# Patient Record
Sex: Female | Born: 1990
Health system: Southern US, Community
[De-identification: ages and names within clinical notes are randomized; demographics above are authoritative.]

## PROBLEM LIST (undated history)

## (undated) ENCOUNTER — Inpatient Hospital Stay (HOSPITAL_COMMUNITY): Payer: Self-pay

## (undated) DIAGNOSIS — N870 Mild cervical dysplasia: Secondary | ICD-10-CM

## (undated) DIAGNOSIS — N809 Endometriosis, unspecified: Secondary | ICD-10-CM

## (undated) DIAGNOSIS — G35 Multiple sclerosis: Secondary | ICD-10-CM

## (undated) DIAGNOSIS — R87611 Atypical squamous cells cannot exclude high grade squamous intraepithelial lesion on cytologic smear of cervix (ASC-H): Secondary | ICD-10-CM

## (undated) DIAGNOSIS — F410 Panic disorder [episodic paroxysmal anxiety] without agoraphobia: Secondary | ICD-10-CM

## (undated) HISTORY — DX: Atypical squamous cells cannot exclude high grade squamous intraepithelial lesion on cytologic smear of cervix (ASC-H): R87.611

## (undated) HISTORY — PX: TONSILLECTOMY: SUR1361

## (undated) HISTORY — DX: Mild cervical dysplasia: N87.0

## (undated) HISTORY — DX: Multiple sclerosis: G35

## (undated) HISTORY — PX: WISDOM TOOTH EXTRACTION: SHX21

## (undated) HISTORY — DX: Endometriosis, unspecified: N80.9

## (undated) HISTORY — PX: DIAGNOSTIC LAPAROSCOPY: SUR761

---

## 2010-12-10 DIAGNOSIS — N870 Mild cervical dysplasia: Secondary | ICD-10-CM

## 2010-12-10 DIAGNOSIS — R87611 Atypical squamous cells cannot exclude high grade squamous intraepithelial lesion on cytologic smear of cervix (ASC-H): Secondary | ICD-10-CM

## 2010-12-10 HISTORY — DX: Mild cervical dysplasia: N87.0

## 2010-12-10 HISTORY — DX: Atypical squamous cells cannot exclude high grade squamous intraepithelial lesion on cytologic smear of cervix (ASC-H): R87.611

## 2014-09-17 ENCOUNTER — Emergency Department (HOSPITAL_COMMUNITY)
Admission: EM | Admit: 2014-09-17 | Discharge: 2014-09-18 | Disposition: A | Discharge status: Home / Self Care | Payer: Self-pay | Attending: Emergency Medicine | Admitting: Emergency Medicine

## 2014-09-17 ENCOUNTER — Encounter (HOSPITAL_COMMUNITY): Payer: Self-pay | Admitting: Emergency Medicine

## 2014-09-17 ENCOUNTER — Emergency Department (HOSPITAL_COMMUNITY): Payer: Self-pay

## 2014-09-17 DIAGNOSIS — R000 Tachycardia, unspecified: Secondary | ICD-10-CM | POA: Insufficient documentation

## 2014-09-17 DIAGNOSIS — Z3202 Encounter for pregnancy test, result negative: Secondary | ICD-10-CM | POA: Insufficient documentation

## 2014-09-17 DIAGNOSIS — R002 Palpitations: Secondary | ICD-10-CM | POA: Insufficient documentation

## 2014-09-17 DIAGNOSIS — R202 Paresthesia of skin: Secondary | ICD-10-CM | POA: Insufficient documentation

## 2014-09-17 DIAGNOSIS — Z720 Tobacco use: Secondary | ICD-10-CM | POA: Insufficient documentation

## 2014-09-17 DIAGNOSIS — M79602 Pain in left arm: Secondary | ICD-10-CM | POA: Insufficient documentation

## 2014-09-17 DIAGNOSIS — R0602 Shortness of breath: Secondary | ICD-10-CM | POA: Insufficient documentation

## 2014-09-17 DIAGNOSIS — F419 Anxiety disorder, unspecified: Secondary | ICD-10-CM | POA: Insufficient documentation

## 2014-09-17 DIAGNOSIS — R079 Chest pain, unspecified: Secondary | ICD-10-CM | POA: Insufficient documentation

## 2014-09-17 HISTORY — DX: Panic disorder (episodic paroxysmal anxiety): F41.0

## 2014-09-17 LAB — CBC WITH DIFFERENTIAL/PLATELET
Basophils Absolute: 0 10*3/uL (ref 0.0–0.1)
Basophils Relative: 0 % (ref 0–1)
Eosinophils Absolute: 0.2 10*3/uL (ref 0.0–0.7)
Eosinophils Relative: 2 % (ref 0–5)
HCT: 41.8 % (ref 36.0–46.0)
Hemoglobin: 14.4 g/dL (ref 12.0–15.0)
Lymphocytes Relative: 29 % (ref 12–46)
Lymphs Abs: 2.6 10*3/uL (ref 0.7–4.0)
MCH: 31.4 pg (ref 26.0–34.0)
MCHC: 34.4 g/dL (ref 30.0–36.0)
MCV: 91.1 fL (ref 78.0–100.0)
Monocytes Absolute: 0.5 10*3/uL (ref 0.1–1.0)
Monocytes Relative: 6 % (ref 3–12)
NEUTROS ABS: 5.7 10*3/uL (ref 1.7–7.7)
Neutrophils Relative %: 63 % (ref 43–77)
Platelets: 261 10*3/uL (ref 150–400)
RBC: 4.59 MIL/uL (ref 3.87–5.11)
RDW: 12.7 % (ref 11.5–15.5)
WBC: 9 10*3/uL (ref 4.0–10.5)

## 2014-09-17 LAB — BASIC METABOLIC PANEL
ANION GAP: 17 — AB (ref 5–15)
BUN: 10 mg/dL (ref 6–23)
CHLORIDE: 100 meq/L (ref 96–112)
CO2: 23 mEq/L (ref 19–32)
Calcium: 9.2 mg/dL (ref 8.4–10.5)
Creatinine, Ser: 0.55 mg/dL (ref 0.50–1.10)
GFR calc Af Amer: >90 mL/min (ref >90)
GFR calc non Af Amer: >90 mL/min (ref >90)
Glucose, Bld: 110 mg/dL — ABNORMAL HIGH (ref 70–99)
Potassium: 3.6 mEq/L — ABNORMAL LOW (ref 3.7–5.3)
Sodium: 140 mEq/L (ref 137–147)

## 2014-09-17 LAB — D-DIMER, QUANTITATIVE (NOT AT ARMC)

## 2014-09-17 LAB — POC URINE PREG, ED: Preg Test, Ur: NEGATIVE

## 2014-09-17 LAB — I-STAT TROPONIN, ED: Troponin i, poc: 0 ng/mL (ref 0.00–0.08)

## 2014-09-17 NOTE — ED Provider Notes (Signed)
CSN: [id number]     Arrival date & time 09/17/14  2120 History   First MD Initiated Contact with Patient 09/17/14 2209     Chief Complaint  Patient presents with  . Palpitations     (Consider location/radiation/quality/duration/timing/severity/associated sxs/prior Treatment) Patient is a 23 y.o. female presenting with palpitations. The history is provided by the patient and medical records.  Palpitations Associated symptoms: chest pain and shortness of breath    This is a 23 year old female with past medical history significant for anxiety, presenting to the ED for chest pain and palpitations intermittently for the past week.  Patient states when pain comes, localized throughout her entire chest associated with some SOB, pain of her left arm, and paresthesias of her bilateral hands.  She denies any dizziness or weakness.  Patient states some similarities between her recent symptoms and her anxiety symptoms, however seem much more severe. She denies any prior cardiac history. Great grandfather died of massive MI in 79's.  Patient is a daily smoker, started back smoking 2 months ago after quitting for several months.  No recent travel, LE edema, calf pain, recent surgeries, or prolonged immobilization. She is not currently on any exogenous estrogens. She denies history of prior DVT or PE.  Does admit to recent increased stress -- recently moved to [facility] from [place], [place] and has started school.  Patient tachycardic on arrival.  Past Medical History  Diagnosis Date  . Panic    Past Surgical History  Procedure Laterality Date  . Tonsillectomy     No family history on file. History  Substance Use Topics  . Smoking status: Current Every Day Smoker  . Smokeless tobacco: Not on file  . Alcohol Use: Yes     Comment: occ   OB History   Grav Para Term Preterm Abortions TAB SAB Ect Mult Living                 Review of Systems  Respiratory: Positive for shortness of breath.    Cardiovascular: Positive for chest pain and palpitations.  All other systems reviewed and are negative.     Allergies  Review of patient's allergies indicates no known allergies.  Home Medications   Prior to Admission medications   Not on File   BP 158/107  Pulse 108  Temp(Src) 98.3 F (36.8 C) (Oral)  Resp 15  Ht 5\' 3"  (1.6 m)  Wt 150 lb (68.04 kg)  BMI 26.58 kg/m2  SpO2 98%  LMP 08/27/2014  Physical Exam  Nursing note and vitals reviewed. Constitutional: She is oriented to person, place, and time. She appears well-developed and well-nourished.  HENT:  Head: Normocephalic and atraumatic.  Mouth/Throat: Oropharynx is clear and moist.  Eyes: Conjunctivae and EOM are normal. Pupils are equal, round, and reactive to light.  Neck: Normal range of motion. Neck supple.  Cardiovascular: Regular rhythm and normal heart sounds.  Tachycardia present.   Pulmonary/Chest: Effort normal and breath sounds normal. No respiratory distress. She has no wheezes.  Abdominal: Soft. Bowel sounds are normal.  Musculoskeletal: Normal range of motion.  Neurological: She is alert and oriented to person, place, and time.  Skin: Skin is warm and dry.  Psychiatric: Her mood appears anxious.  Anxious, tearful    ED Course  Procedures (including critical care time) Labs Review Labs Reviewed  BASIC METABOLIC PANEL - Abnormal; Notable for the following:    Potassium 3.6 (*)    Glucose, Bld 110 (*)    Anion gap  17 (*)    All other components within normal limits  CBC WITH DIFFERENTIAL  D-DIMER, QUANTITATIVE  I-STAT TROPOININ, ED  POC URINE PREG, ED    Imaging Review Dg Chest 2 View  09/17/2014   CLINICAL DATA:  Acute onset of chest pain and shortness of breath for 1 week. Current history of smoking. Initial encounter.  EXAM: CHEST  2 VIEW  COMPARISON:  None.  FINDINGS: The lungs are well-aerated and clear. There is no evidence of focal opacification, pleural effusion or pneumothorax.  The  heart is normal in size; the mediastinal contour is within normal limits. No acute osseous abnormalities are seen. Bilateral nipple shadows are seen.  IMPRESSION: No acute cardiopulmonary process seen.   Electronically Signed   By: Roanna RaiderJeffery  Chang M.D.   On: 09/17/2014 22:55     EKG Interpretation None      MDM   Final diagnoses:  Chest pain, unspecified chest pain type  Palpitations   23 year old female with history of anxiety, presenting to the ED for intermittent chest discomfort and palpitations over the past week. She states some similarity of symptoms between her anxiety and current sx, but states recently have been much worse. Does admit to recently increased stress due to relocation and starting school.  EKG sinus tachycardia without ischemic change.  Chest x-ray is clear. Lab work including troponin and d-dimer negative.  At this time, low suspicion for ACS, PE, dissection, or other acute cardiac event. Suspect that anxiety is likely contributing to her symptoms.  Denies SI/HI/AVH.  Patient was previously seen by counselor, which she states helped her symptoms greatly. Will give psychiatric resource guide so that she may reestablish care here in [place]. Short supply of Ativan as needed until followup.  Discussed plan with patient, he/she acknowledged understanding and agreed with plan of care.  Return precautions given for new or worsening symptoms.  Garlon HatchetLisa M Zhanae Proffit, PA-C 09/18/14 [phone]

## 2014-09-17 NOTE — ED Notes (Signed)
Pt c/o upper chest discomfort with intermittent palpitations x 1 week. Pt states she has anxiety but this does not feel like her normal panic attacks.

## 2014-09-18 MED ORDER — LORAZEPAM 1 MG PO TABS: 1.0000 mg | ORAL_TABLET | Freq: Three times a day (TID) | ORAL | Status: DC | PRN

## 2014-09-18 NOTE — Discharge Instructions (Signed)
Take the prescribed medication as directed. Follow-up with one of the counseling or psychiatry services on the resource list if you wish to do so. Return to the ED for new or worsening symptoms.

## 2014-09-18 NOTE — ED Provider Notes (Signed)
Medical screening examination/treatment/procedure(s) were performed by non-physician practitioner and as supervising physician I was immediately available for consultation/collaboration.   EKG Interpretation None       Antonius Hartlage, MD 09/18/14 1955 

## 2014-09-28 ENCOUNTER — Encounter (HOSPITAL_COMMUNITY): Payer: Self-pay | Admitting: Emergency Medicine

## 2014-09-28 ENCOUNTER — Emergency Department (HOSPITAL_COMMUNITY)
Admission: EM | Admit: 2014-09-28 | Discharge: 2014-09-28 | Discharge status: Against Medical Advice | Payer: Self-pay | Attending: Emergency Medicine | Admitting: Emergency Medicine

## 2014-09-28 DIAGNOSIS — Z5321 Procedure and treatment not carried out due to patient leaving prior to being seen by health care provider: Secondary | ICD-10-CM

## 2014-09-28 DIAGNOSIS — Z720 Tobacco use: Secondary | ICD-10-CM | POA: Insufficient documentation

## 2014-09-28 DIAGNOSIS — R079 Chest pain, unspecified: Secondary | ICD-10-CM | POA: Insufficient documentation

## 2014-09-28 NOTE — ED Notes (Signed)
Called multiple time no answer.

## 2014-09-28 NOTE — ED Notes (Signed)
Pt reports central chest burning that "goes into my throat." States this started that smoking marijuana for the first time. States after this her "heart started racing, my chest started burning." Pt states she just wants to get checked out. Pt in NAD.

## 2014-10-03 NOTE — ED Provider Notes (Signed)
Pt left without being seen.  Mirian Mo, MD 10/03/14 [phone]

## 2015-01-20 ENCOUNTER — Encounter (HOSPITAL_COMMUNITY): Payer: Self-pay | Admitting: *Deleted

## 2015-01-20 ENCOUNTER — Emergency Department (HOSPITAL_COMMUNITY)
Admission: EM | Admit: 2015-01-20 | Discharge: 2015-01-20 | Disposition: A | Discharge status: Home / Self Care | Payer: Self-pay | Attending: Emergency Medicine | Admitting: Emergency Medicine

## 2015-01-20 ENCOUNTER — Emergency Department (HOSPITAL_COMMUNITY): Payer: Self-pay

## 2015-01-20 DIAGNOSIS — Z3202 Encounter for pregnancy test, result negative: Secondary | ICD-10-CM | POA: Insufficient documentation

## 2015-01-20 DIAGNOSIS — R002 Palpitations: Secondary | ICD-10-CM | POA: Insufficient documentation

## 2015-01-20 DIAGNOSIS — Z720 Tobacco use: Secondary | ICD-10-CM | POA: Insufficient documentation

## 2015-01-20 DIAGNOSIS — R0602 Shortness of breath: Secondary | ICD-10-CM | POA: Insufficient documentation

## 2015-01-20 DIAGNOSIS — R0789 Other chest pain: Secondary | ICD-10-CM | POA: Insufficient documentation

## 2015-01-20 DIAGNOSIS — F419 Anxiety disorder, unspecified: Secondary | ICD-10-CM | POA: Insufficient documentation

## 2015-01-20 LAB — BASIC METABOLIC PANEL
Anion gap: 10 (ref 5–15)
BUN: 16 mg/dL (ref 6–23)
CO2: 25 mmol/L (ref 19–32)
Calcium: 9.4 mg/dL (ref 8.4–10.5)
Chloride: 103 mmol/L (ref 96–112)
Creatinine, Ser: 0.66 mg/dL (ref 0.50–1.10)
GFR calc Af Amer: >90 mL/min (ref >90)
GFR calc non Af Amer: >90 mL/min (ref >90)
GLUCOSE: 99 mg/dL (ref 70–99)
Potassium: 3.5 mmol/L (ref 3.5–5.1)
Sodium: 138 mmol/L (ref 135–145)

## 2015-01-20 LAB — CBC
HEMATOCRIT: 43.3 % (ref 36.0–46.0)
Hemoglobin: 14.9 g/dL (ref 12.0–15.0)
MCH: 32.3 pg (ref 26.0–34.0)
MCHC: 34.4 g/dL (ref 30.0–36.0)
MCV: 93.9 fL (ref 78.0–100.0)
Platelets: 263 10*3/uL (ref 150–400)
RBC: 4.61 MIL/uL (ref 3.87–5.11)
RDW: 12.7 % (ref 11.5–15.5)
WBC: 9.3 10*3/uL (ref 4.0–10.5)

## 2015-01-20 LAB — I-STAT TROPONIN, ED: Troponin i, poc: 0 ng/mL (ref 0.00–0.08)

## 2015-01-20 LAB — PREGNANCY, URINE: PREG TEST UR: NEGATIVE

## 2015-01-20 MED ORDER — ALPRAZOLAM 0.25 MG PO TABS
0.2500 mg | ORAL_TABLET | Freq: Every evening | ORAL | Status: DC | PRN
Start: 2015-01-20 — End: 2016-12-21

## 2015-01-20 NOTE — ED Notes (Signed)
Pt reports mid-cp which started x 2 hours ago.  Pt reports dizziness intermittently since the pain started.  Pt reports pain radiates to her L arm.  Pt also reports tingling in her L hand.  Reports pain is worse when sitting down.  Attempted to lay down around 2200 and pain became worse.  Pt reports her great great grandfather died of a heart attack in his 60s.

## 2015-01-20 NOTE — ED Notes (Signed)
Bed: WA25 Expected date:  Expected time:  Means of arrival:  Comments: Triage 2 

## 2015-01-20 NOTE — ED Notes (Signed)
Pt. On cardiac monitor. 

## 2015-01-20 NOTE — Discharge Instructions (Signed)
Panic Attacks °Panic attacks are sudden, short-lived surges of severe anxiety, fear, or discomfort. They may occur for no reason when you are relaxed, when you are anxious, or when you are sleeping. Panic attacks may occur for a number of reasons:  °· Healthy people occasionally have panic attacks in extreme, life-threatening situations, such as war or natural disasters. Normal anxiety is a protective mechanism of the body that helps us react to danger (fight or flight response). °· Panic attacks are often seen with anxiety disorders, such as panic disorder, social anxiety disorder, generalized anxiety disorder, and phobias. Anxiety disorders cause excessive or uncontrollable anxiety. They may interfere with your relationships or other life activities. °· Panic attacks are sometimes seen with other mental illnesses, such as depression and posttraumatic stress disorder. °· Certain medical conditions, prescription medicines, and drugs of abuse can cause panic attacks. °SYMPTOMS  °Panic attacks start suddenly, peak within 20 minutes, and are accompanied by four or more of the following symptoms: °· Pounding heart or fast heart rate (palpitations). °· Sweating. °· Trembling or shaking. °· Shortness of breath or feeling smothered. °· Feeling choked. °· Chest pain or discomfort. °· Nausea or strange feeling in your stomach. °· Dizziness, light-headedness, or feeling like you will faint. °· Chills or hot flushes. °· Numbness or tingling in your lips or hands and feet. °· Feeling that things are not real or feeling that you are not yourself. °· Fear of losing control or going crazy. °· Fear of dying. °Some of these symptoms can mimic serious medical conditions. For example, you may think you are having a heart attack. Although panic attacks can be very scary, they are not life threatening. °DIAGNOSIS  °Panic attacks are diagnosed through an assessment by your health care provider. Your health care provider will ask  questions about your symptoms, such as where and when they occurred. Your health care provider will also ask about your medical history and use of alcohol and drugs, including prescription medicines. Your health care provider may order blood tests or other studies to rule out a serious medical condition. Your health care provider may refer you to a mental health professional for further evaluation. °TREATMENT  °· Most healthy people who have one or two panic attacks in an extreme, life-threatening situation will not require treatment. °· The treatment for panic attacks associated with anxiety disorders or other mental illness typically involves counseling with a mental health professional, medicine, or a combination of both. Your health care provider will help determine what treatment is best for you. °· Panic attacks due to physical illness usually go away with treatment of the illness. If prescription medicine is causing panic attacks, talk with your health care provider about stopping the medicine, decreasing the dose, or substituting another medicine. °· Panic attacks due to alcohol or drug abuse go away with abstinence. Some adults need professional help in order to stop drinking or using drugs. °HOME CARE INSTRUCTIONS  °· Take all medicines as directed by your health care provider.   °· Schedule and attend follow-up visits as directed by your health care provider. It is important to keep all your appointments. °SEEK MEDICAL CARE IF: °· You are not able to take your medicines as prescribed. °· Your symptoms do not improve or get worse. °SEEK IMMEDIATE MEDICAL CARE IF:  °· You experience panic attack symptoms that are different than your usual symptoms. °· You have serious thoughts about hurting yourself or others. °· You are taking medicine for panic attacks and   have a serious side effect. °MAKE SURE YOU: °· Understand these instructions. °· Will watch your condition. °· Will get help right away if you are not  doing well or get worse. °Document Released: 11/26/2005 Document Revised: 12/01/2013 Document Reviewed: 07/10/2013 °ExitCare® Patient Information ©2015 [employer]. This information is not intended to replace advice given to you by your health care provider. Make sure you discuss any questions you have with your health care provider. ° ° °Chest Pain (Nonspecific) °It is often hard to give a specific diagnosis for the cause of chest pain. There is always a chance that your pain could be related to something serious, such as a heart attack or a blood clot in the lungs. You need to follow up with your health care provider for further evaluation. °CAUSES  °· Heartburn. °· Pneumonia or bronchitis. °· Anxiety or stress. °· Inflammation around your heart (pericarditis) or lung (pleuritis or pleurisy). °· A blood clot in the lung. °· A collapsed lung (pneumothorax). It can develop suddenly on its own (spontaneous pneumothorax) or from trauma to the chest. °· Shingles infection (herpes zoster virus). °The chest wall is composed of bones, muscles, and cartilage. Any of these can be the source of the pain. °· The bones can be bruised by injury. °· The muscles or cartilage can be strained by coughing or overwork. °· The cartilage can be affected by inflammation and become sore (costochondritis). °DIAGNOSIS  °Lab tests or other studies may be needed to find the cause of your pain. Your health care provider may have you take a test called an ambulatory electrocardiogram (ECG). An ECG records your heartbeat patterns over a 24-hour period. You may also have other tests, such as: °· Transthoracic echocardiogram (TTE). During echocardiography, sound waves are used to evaluate how blood flows through your heart. °· Transesophageal echocardiogram (TEE). °· Cardiac monitoring. This allows your health care provider to monitor your heart rate and rhythm in real time. °· Holter monitor. This is a portable device that records your  heartbeat and can help diagnose heart arrhythmias. It allows your health care provider to track your heart activity for several days, if needed. °· Stress tests by exercise or by giving medicine that makes the heart beat faster. °TREATMENT  °· Treatment depends on what may be causing your chest pain. Treatment may include: °¨ Acid blockers for heartburn. °¨ Anti-inflammatory medicine. °¨ Pain medicine for inflammatory conditions. °¨ Antibiotics if an infection is present. °· You may be advised to change lifestyle habits. This includes stopping smoking and avoiding alcohol, caffeine, and chocolate. °· You may be advised to keep your head raised (elevated) when sleeping. This reduces the chance of acid going backward from your stomach into your esophagus. °Most of the time, nonspecific chest pain will improve within 2-3 days with rest and mild pain medicine.  °HOME CARE INSTRUCTIONS  °· If antibiotics were prescribed, take them as directed. Finish them even if you start to feel better. °· For the next few days, avoid physical activities that bring on chest pain. Continue physical activities as directed. °· Do not use any tobacco products, including cigarettes, chewing tobacco, or electronic cigarettes. °· Avoid drinking alcohol. °· Only take medicine as directed by your health care provider. °· Follow your health care provider's suggestions for further testing if your chest pain does not go away. °· Keep any follow-up appointments you made. If you do not go to an appointment, you could develop lasting (chronic) problems with pain. If there   is any problem keeping an appointment, call to reschedule. °SEEK MEDICAL CARE IF:  °· Your chest pain does not go away, even after treatment. °· You have a rash with blisters on your chest. °· You have a fever. °SEEK IMMEDIATE MEDICAL CARE IF:  °· You have increased chest pain or pain that spreads to your arm, neck, jaw, back, or abdomen. °· You have shortness of breath. °· You have  an increasing cough, or you cough up blood. °· You have severe back or abdominal pain. °· You feel nauseous or vomit. °· You have severe weakness. °· You faint. °· You have chills. °This is an emergency. Do not wait to see if the pain will go away. Get medical help at once. Call your local emergency services (911 in [place] Do not drive yourself to the hospital. °MAKE SURE YOU:  °· Understand these instructions. °· Will watch your condition. °· Will get help right away if you are not doing well or get worse. °Document Released: 09/05/2005 Document Revised: 12/01/2013 Document Reviewed: 07/01/2008 °ExitCare® Patient Information ©2015 [employer]. This information is not intended to replace advice given to you by your health care provider. Make sure you discuss any questions you have with your health care provider. ° ° °

## 2015-01-20 NOTE — ED Provider Notes (Signed)
CSN: [id number]     Arrival date & time 01/20/15  0000 History   First MD Initiated Contact with Patient 01/20/15 0029     Chief Complaint  Patient presents with  . Chest Pain     (Consider location/radiation/quality/duration/timing/severity/associated sxs/prior Treatment) HPI Patient with history of panic disorder presents with 2 hours of substernal chest pain. She states the pain is stabbing in nature. Worse when lying back or on her given. Associated with shortness of breath and tingling in both hands. Patient states she has chest pain daily due to her anxiety. Pain is improved significantly since being in the emergency department. She denies any lower extremity swelling or pain. She's had no cough or fever. States she smokes about half a pack of cigarettes daily. No recent extended travel or surgeries. Past Medical History  Diagnosis Date  . Panic    Past Surgical History  Procedure Laterality Date  . Tonsillectomy     No family history on file. History  Substance Use Topics  . Smoking status: Current Every Day Smoker -- 0.50 packs/day    Types: Cigarettes  . Smokeless tobacco: Not on file  . Alcohol Use: Yes     Comment: occ   OB History    No data available     Review of Systems  Constitutional: Negative for fever and chills.  Respiratory: Positive for shortness of breath. Negative for cough.   Cardiovascular: Positive for chest pain and palpitations. Negative for leg swelling.  Gastrointestinal: Negative for vomiting, abdominal pain and diarrhea.  Musculoskeletal: Negative for back pain, neck pain and neck stiffness.  Neurological: Negative for dizziness, weakness, light-headedness, numbness and headaches.  Psychiatric/Behavioral: The patient is nervous/anxious.   All other systems reviewed and are negative.     Allergies  Review of patient's allergies indicates no known allergies.  Home Medications   Prior to Admission medications   Not on File   BP  147/95 mmHg  Pulse 89  Temp(Src) 97.9 F (36.6 C) (Oral)  Resp 18  SpO2 100%  LMP 12/29/2014 Physical Exam  Constitutional: She is oriented to person, place, and time. She appears well-developed and well-nourished. No distress.  Anxious appearing  HENT:  Head: Normocephalic and atraumatic.  Mouth/Throat: Oropharynx is clear and moist.  Eyes: EOM are normal. Pupils are equal, round, and reactive to light.  Neck: Normal range of motion. Neck supple.  Cardiovascular: Normal rate and regular rhythm.   Pulmonary/Chest: Effort normal and breath sounds normal. No respiratory distress. She has no wheezes. She has no rales. She exhibits no tenderness.  Abdominal: Soft. Bowel sounds are normal. She exhibits no distension and no mass. There is no tenderness. There is no rebound and no guarding.  Musculoskeletal: Normal range of motion. She exhibits no edema or tenderness.  No calf swelling or tenderness  Neurological: She is alert and oriented to person, place, and time.  Moves all extremities without deficit. Sensation intact.  Skin: Skin is warm and dry. No rash noted. No erythema.  Psychiatric: Her behavior is normal.  Anxious. Pressured speech.  Nursing note and vitals reviewed.   ED Course  Procedures (including critical care time) Labs Review Labs Reviewed  CBC  BASIC METABOLIC PANEL  PREGNANCY, URINE  I-STAT TROPOININ, ED    Imaging Review No results found.   EKG Interpretation   Date/Time:  Thursday January 20 2015 00:17:30 EST Ventricular Rate:  82 PR Interval:  165 QRS Duration: 95 QT Interval:  362 QTC Calculation: 423 R  Axis:   76 Text Interpretation:  Sinus rhythm RSR' in V1 or V2, right VCD or RVH  Confirmed by Ranae Palms  MD, Gavino Fouch ([id number]) on 01/20/2015 1:06:10 AM      MDM   Final diagnoses:  None    Patient with no risk factors for PE. Chest tightness has resolved. Symptoms consistent with panic disorder. Patient is reassured and asking to be  discharged home.    Loren Racer, MD 01/20/15 [phone]

## 2015-01-20 NOTE — ED Notes (Signed)
Pt states she is ready to leave, states "if I"m not having a heart attack then I'm ready to leave I have to work in the morning", EDP made aware and pt to be discharged.

## 2015-05-22 ENCOUNTER — Encounter (HOSPITAL_COMMUNITY): Payer: Self-pay | Admitting: *Deleted

## 2015-05-22 ENCOUNTER — Emergency Department (HOSPITAL_COMMUNITY)
Admission: EM | Admit: 2015-05-22 | Discharge: 2015-05-24 | Discharge status: Against Medical Advice | Payer: Self-pay | Attending: Emergency Medicine | Admitting: Emergency Medicine

## 2015-05-22 DIAGNOSIS — Z720 Tobacco use: Secondary | ICD-10-CM | POA: Insufficient documentation

## 2015-05-22 DIAGNOSIS — Z5320 Procedure and treatment not carried out because of patient's decision for unspecified reasons: Secondary | ICD-10-CM

## 2015-05-22 DIAGNOSIS — M79603 Pain in arm, unspecified: Secondary | ICD-10-CM | POA: Insufficient documentation

## 2015-05-22 DIAGNOSIS — R079 Chest pain, unspecified: Secondary | ICD-10-CM | POA: Insufficient documentation

## 2015-05-22 DIAGNOSIS — Z9119 Patient's noncompliance with other medical treatment and regimen: Secondary | ICD-10-CM

## 2015-05-22 NOTE — ED Notes (Signed)
The pt is c/o chest and lt arm burning pain for one hour  History of the same.  Dx anxiety.  She is not able to sleep or smoke during these attacks  lmp June  7th

## 2015-05-22 NOTE — ED Notes (Signed)
Attempted to transport pt to treatment room. Pt d\ecided she did not to be seen. Advised pt Dr. Was waiting to she her, advised her of the risks of leaving pt understands the risks.

## 2015-05-23 NOTE — ED Provider Notes (Signed)
Pt left before being seen after triage.   Toy Cookey, MD 05/23/15 2116

## 2015-08-04 NOTE — H&P (Addendum)
Gina Adams is an 24 y.o. female G1 @ 8 wks by LMP presents for surgical mngt of missed Ab.  No vb, occasional cramps   Menstrual History:  No LMP recorded.    Past Medical History  Diagnosis Date  . Panic     Past Surgical History  Procedure Laterality Date  . Tonsillectomy      No family history on file.  Social History:  reports that she has been smoking Cigarettes.  She has been smoking about 0.50 packs per day. She does not have any smokeless tobacco history on file. She reports that she drinks alcohol. She reports that she uses illicit drugs (Marijuana).  Allergies: No Known Allergies  No prescriptions prior to admission    ROS  Physical Exam AF, VSS Gen - NAD Abd - soft, NT/ND Ext - NT, no edema PV - deferred  [place]:  IUP @ [redacted]w[redacted]d w/ no FHT  Assessment/Plan:  Missed Ab D&E R/b/a discussed, questions answered, informed consent  Dani Danis 08/04/2015, 11:14 AM

## 2015-08-05 ENCOUNTER — Encounter (HOSPITAL_COMMUNITY): Payer: Self-pay | Admitting: *Deleted

## 2015-08-05 NOTE — Anesthesia Preprocedure Evaluation (Addendum)
Anesthesia Evaluation  Patient identified by MRN, date of birth, ID band Patient awake    Reviewed: Allergy & Precautions, NPO status , Patient's Chart, lab work & pertinent test results  Airway Mallampati: II   Neck ROM: Full    Dental  (+) Teeth Intact, Dental Advisory Given   Pulmonary Current Smoker,  breath sounds clear to auscultation        Cardiovascular negative cardio ROS  Rhythm:Regular     Neuro/Psych Anxiety Panic attacks   GI/Hepatic negative GI ROS, Neg liver ROS, (+)     substance abuse  marijuana use,   Endo/Other  negative endocrine ROS  Renal/GU negative Renal ROS     Musculoskeletal   Abdominal (+)  Abdomen: soft.    Peds  Hematology 14/42   Anesthesia Other Findings   Reproductive/Obstetrics                         Anesthesia Physical Anesthesia Plan  ASA: II  Anesthesia Plan: MAC   Post-op Pain Management:    Induction: Intravenous  Airway Management Planned: Natural Airway and Simple Face Mask  Additional Equipment:   Intra-op Plan:   Post-operative Plan:   Informed Consent: I have reviewed the patients History and Physical, chart, labs and discussed the procedure including the risks, benefits and alternatives for the proposed anesthesia with the patient or authorized representative who has indicated his/her understanding and acceptance.     Plan Discussed with:   Anesthesia Plan Comments: ( 8 weeks Missed AB, check labs)       Anesthesia Quick Evaluation

## 2015-08-07 MED ORDER — CEFOTETAN DISODIUM 2 G IJ SOLR
2.0000 g | INTRAMUSCULAR | Status: AC
  Administered 2015-08-08: 2 g via INTRAVENOUS
  Filled 2015-08-07: qty 2

## 2015-08-08 ENCOUNTER — Ambulatory Visit (HOSPITAL_COMMUNITY)
Admission: RE | Admit: 2015-08-08 | Discharge: 2015-08-08 | Disposition: A | Discharge status: Home / Self Care | Payer: BLUE CROSS/BLUE SHIELD | Source: Ambulatory Visit | Attending: Obstetrics and Gynecology | Admitting: Obstetrics and Gynecology

## 2015-08-08 ENCOUNTER — Ambulatory Visit (HOSPITAL_COMMUNITY): Payer: BLUE CROSS/BLUE SHIELD | Admitting: Anesthesiology

## 2015-08-08 ENCOUNTER — Encounter (HOSPITAL_COMMUNITY)
Admission: RE | Disposition: A | Discharge status: Home / Self Care | Payer: Self-pay | Source: Ambulatory Visit | Attending: Obstetrics and Gynecology

## 2015-08-08 ENCOUNTER — Encounter (HOSPITAL_COMMUNITY): Payer: Self-pay

## 2015-08-08 DIAGNOSIS — O021 Missed abortion: Secondary | ICD-10-CM | POA: Insufficient documentation

## 2015-08-08 DIAGNOSIS — F17210 Nicotine dependence, cigarettes, uncomplicated: Secondary | ICD-10-CM | POA: Insufficient documentation

## 2015-08-08 HISTORY — PX: DILATION AND EVACUATION: SHX1459

## 2015-08-08 LAB — TYPE AND SCREEN
ABO/RH(D): O NEG
Antibody Screen: NEGATIVE

## 2015-08-08 LAB — CBC
HEMATOCRIT: 41.7 % (ref 36.0–46.0)
HEMOGLOBIN: 14.7 g/dL (ref 12.0–15.0)
MCH: 32.6 pg (ref 26.0–34.0)
MCHC: 35.3 g/dL (ref 30.0–36.0)
MCV: 92.5 fL (ref 78.0–100.0)
PLATELETS: 293 10*3/uL (ref 150–400)
RBC: 4.51 MIL/uL (ref 3.87–5.11)
RDW: 12.5 % (ref 11.5–15.5)
WBC: 8.8 10*3/uL (ref 4.0–10.5)

## 2015-08-08 LAB — ABO/RH: ABO/RH(D): O NEG

## 2015-08-08 SURGERY — DILATION AND EVACUATION, UTERUS
Anesthesia: Monitor Anesthesia Care

## 2015-08-08 MED ORDER — LACTATED RINGERS IV SOLN
INTRAVENOUS | Status: DC
  Administered 2015-08-08: 12:00:00 via INTRAVENOUS

## 2015-08-08 MED ORDER — HYDROCODONE-ACETAMINOPHEN 5-325 MG PO TABS
ORAL_TABLET | ORAL | Status: AC
  Filled 2015-08-08: qty 1

## 2015-08-08 MED ORDER — ONDANSETRON HCL 4 MG/2ML IJ SOLN
INTRAMUSCULAR | Status: DC | PRN
  Administered 2015-08-08: 4 mg via INTRAVENOUS

## 2015-08-08 MED ORDER — GLYCOPYRROLATE 0.2 MG/ML IJ SOLN
INTRAMUSCULAR | Status: AC
  Filled 2015-08-08: qty 1

## 2015-08-08 MED ORDER — SCOPOLAMINE 1 MG/3DAYS TD PT72
1.0000 | MEDICATED_PATCH | Freq: Once | TRANSDERMAL | Status: DC
  Administered 2015-08-08: 1.5 mg via TRANSDERMAL

## 2015-08-08 MED ORDER — PROPOFOL 10 MG/ML IV BOLUS
INTRAVENOUS | Status: AC
  Filled 2015-08-08: qty 20

## 2015-08-08 MED ORDER — GLYCOPYRROLATE 0.2 MG/ML IJ SOLN
INTRAMUSCULAR | Status: DC | PRN
  Administered 2015-08-08: 0.2 mg via INTRAVENOUS

## 2015-08-08 MED ORDER — LIDOCAINE HCL (CARDIAC) 20 MG/ML IV SOLN
INTRAVENOUS | Status: AC
  Filled 2015-08-08: qty 5

## 2015-08-08 MED ORDER — HYDROMORPHONE HCL 1 MG/ML IJ SOLN
INTRAMUSCULAR | Status: AC
  Filled 2015-08-08: qty 1

## 2015-08-08 MED ORDER — HYDROCODONE-ACETAMINOPHEN 5-325 MG PO TABS: 1.0000 | ORAL_TABLET | Freq: Four times a day (QID) | ORAL | Status: DC | PRN

## 2015-08-08 MED ORDER — MEPERIDINE HCL 25 MG/ML IJ SOLN: 6.2500 mg | INTRAMUSCULAR | Status: DC | PRN

## 2015-08-08 MED ORDER — FENTANYL CITRATE (PF) 100 MCG/2ML IJ SOLN: 25.0000 ug | INTRAMUSCULAR | Status: DC | PRN

## 2015-08-08 MED ORDER — PROPOFOL 500 MG/50ML IV EMUL
INTRAVENOUS | Status: DC | PRN
  Administered 2015-08-08: 50 mg via INTRAVENOUS

## 2015-08-08 MED ORDER — LIDOCAINE HCL (CARDIAC) 20 MG/ML IV SOLN
INTRAVENOUS | Status: DC | PRN
  Administered 2015-08-08: 40 mg via INTRAVENOUS

## 2015-08-08 MED ORDER — HYDROCODONE-ACETAMINOPHEN 5-325 MG PO TABS
1.0000 | ORAL_TABLET | Freq: Once | ORAL | Status: AC
  Administered 2015-08-08: 1 via ORAL

## 2015-08-08 MED ORDER — SCOPOLAMINE 1 MG/3DAYS TD PT72
MEDICATED_PATCH | TRANSDERMAL | Status: AC
  Administered 2015-08-08: 1.5 mg via TRANSDERMAL
  Filled 2015-08-08: qty 1

## 2015-08-08 MED ORDER — PROPOFOL INFUSION 10 MG/ML OPTIME
INTRAVENOUS | Status: DC | PRN
  Administered 2015-08-08: 200 ug/kg/min via INTRAVENOUS

## 2015-08-08 MED ORDER — DEXAMETHASONE SODIUM PHOSPHATE 10 MG/ML IJ SOLN
INTRAMUSCULAR | Status: AC
  Filled 2015-08-08: qty 1

## 2015-08-08 MED ORDER — CHLOROPROCAINE HCL 1 % IJ SOLN
INTRAMUSCULAR | Status: AC
  Filled 2015-08-08: qty 30

## 2015-08-08 MED ORDER — RHO D IMMUNE GLOBULIN 1500 UNIT/2ML IJ SOSY
300.0000 ug | PREFILLED_SYRINGE | Freq: Once | INTRAMUSCULAR | Status: AC
  Administered 2015-08-08: 300 ug via INTRAVENOUS
  Filled 2015-08-08: qty 2

## 2015-08-08 MED ORDER — MIDAZOLAM HCL 2 MG/2ML IJ SOLN
INTRAMUSCULAR | Status: DC | PRN
  Administered 2015-08-08 (×2): 2 mg via INTRAVENOUS

## 2015-08-08 MED ORDER — FENTANYL CITRATE (PF) 100 MCG/2ML IJ SOLN
INTRAMUSCULAR | Status: DC | PRN
  Administered 2015-08-08: 100 ug via INTRAVENOUS

## 2015-08-08 MED ORDER — FENTANYL CITRATE (PF) 100 MCG/2ML IJ SOLN
INTRAMUSCULAR | Status: AC
  Filled 2015-08-08: qty 4

## 2015-08-08 MED ORDER — MIDAZOLAM HCL 2 MG/2ML IJ SOLN
INTRAMUSCULAR | Status: AC
  Filled 2015-08-08: qty 4

## 2015-08-08 MED ORDER — PROMETHAZINE HCL 25 MG/ML IJ SOLN: 6.2500 mg | INTRAMUSCULAR | Status: DC | PRN

## 2015-08-08 MED ORDER — HYDROMORPHONE HCL 1 MG/ML IJ SOLN
INTRAMUSCULAR | Status: DC | PRN
  Administered 2015-08-08: 1 mg via INTRAVENOUS

## 2015-08-08 MED ORDER — CHLOROPROCAINE HCL 1 % IJ SOLN
INTRAMUSCULAR | Status: DC | PRN
Start: 2015-08-08 — End: 2015-08-08
  Administered 2015-08-08: 10 mL

## 2015-08-08 MED ORDER — ONDANSETRON HCL 4 MG/2ML IJ SOLN
INTRAMUSCULAR | Status: AC
  Filled 2015-08-08: qty 2

## 2015-08-08 MED ORDER — SODIUM CHLORIDE 0.9 % IV SOLN
INTRAVENOUS | Status: DC
  Administered 2015-08-08: 16:00:00 via INTRAVENOUS

## 2015-08-08 MED ORDER — KETOROLAC TROMETHAMINE 30 MG/ML IJ SOLN
INTRAMUSCULAR | Status: DC | PRN
  Administered 2015-08-08: 30 mg via INTRAVENOUS

## 2015-08-08 SURGICAL SUPPLY — 18 items
CATH ROBINSON RED A/P 16FR (CATHETERS) ×2 IMPLANT
CLOTH BEACON ORANGE TIMEOUT ST (SAFETY) ×2 IMPLANT
DECANTER SPIKE VIAL GLASS SM (MISCELLANEOUS) ×2 IMPLANT
GLOVE BIO SURGEON STRL SZ 6.5 (GLOVE) ×2 IMPLANT
GLOVE BIOGEL PI IND STRL 7.0 (GLOVE) ×1 IMPLANT
GLOVE BIOGEL PI INDICATOR 7.0 (GLOVE) ×1
GOWN STRL REUS W/TWL LRG LVL3 (GOWN DISPOSABLE) ×4 IMPLANT
KIT BERKELEY 1ST TRIMESTER 3/8 (MISCELLANEOUS) ×2 IMPLANT
NS IRRIG 1000ML POUR BTL (IV SOLUTION) ×2 IMPLANT
PACK VAGINAL MINOR WOMEN LF (CUSTOM PROCEDURE TRAY) ×2 IMPLANT
PAD OB MATERNITY 4.3X12.25 (PERSONAL CARE ITEMS) ×2 IMPLANT
PAD PREP 24X48 CUFFED NSTRL (MISCELLANEOUS) ×2 IMPLANT
SET BERKELEY SUCTION TUBING (SUCTIONS) ×2 IMPLANT
TOWEL OR 17X24 6PK STRL BLUE (TOWEL DISPOSABLE) ×4 IMPLANT
VACURETTE 10 RIGID CVD (CANNULA) IMPLANT
VACURETTE 7MM CVD STRL WRAP (CANNULA) IMPLANT
VACURETTE 8 RIGID CVD (CANNULA) IMPLANT
VACURETTE 9 RIGID CVD (CANNULA) IMPLANT

## 2015-08-08 NOTE — Discharge Instructions (Signed)
DISCHARGE INSTRUCTIONS: D&C / D&E The following instructions have been prepared to help you care for yourself upon your return home.  MAY TAKE IBUPROFEN AFTER 7:40 PM AS NEEDED FOR CRAMPS.  MAY REMOVE SCOP PATCH ON OR BEFORE 08/10/2015.  TAKE A STOOL SOFTNER AND DRINK PLENTY OF WATER WHILE TAKING NARCOTIC PAIN MEDICATION   Personal hygiene:  Use sanitary pads for vaginal drainage, not tampons.  Shower the day after your procedure.  NO tub baths, pools or Jacuzzis for 2-3 weeks.  Wipe front to back after using the bathroom.  Activity and limitations:  Do NOT drive or operate any equipment for 24 hours. The effects of anesthesia are still present and drowsiness may result.  Do NOT rest in bed all day.  Walking is encouraged.  Walk up and down stairs slowly.  You may resume your normal activity in one to two days or as indicated by your physician.  Sexual activity: NO intercourse for at least 2 weeks after the procedure, or as indicated by your physician.  Diet: Eat a light meal as desired this evening. You may resume your usual diet tomorrow.  Return to work: You may resume your work activities in one to two days or as indicated by your doctor.  What to expect after your surgery: Expect to have vaginal bleeding/discharge for 2-3 days and spotting for up to 10 days. It is not unusual to have soreness for up to 1-2 weeks. You may have a slight burning sensation when you urinate for the first day. Mild cramps may continue for a couple of days. You may have a regular period in 2-6 weeks.  Call your doctor for any of the following:  Excessive vaginal bleeding, saturating and changing one pad every hour.  Inability to urinate 6 hours after discharge from hospital.  Pain not relieved by pain medication.  Fever of 100.4 F or greater.  Unusual vaginal discharge or odor.   Patients signature: ______________________  Nurses signature ________________________  Support  person's signature_______________________

## 2015-08-08 NOTE — Anesthesia Postprocedure Evaluation (Signed)
  Anesthesia Post-op Note  Patient: Gina Adams  Procedure(s) Performed: Procedure(s): DILATATION AND EVACUATION (N/A)  Patient Location: PACU  Anesthesia Type:MAC  Level of Consciousness: awake and alert   Airway and Oxygen Therapy: Patient Spontanous Breathing and Patient connected to nasal cannula oxygen  Post-op Pain: mild  Post-op Assessment: Post-op Vital signs reviewed, Patient's Cardiovascular Status Stable, Respiratory Function Stable, Patent Airway and No signs of Nausea or vomiting              Post-op Vital Signs: Reviewed and stable  Last Vitals:  Filed Vitals:   08/08/15 1113  BP: 126/73  Pulse: 88  Temp: 36.5 C  Resp: 18    Complications: No apparent anesthesia complications

## 2015-08-08 NOTE — Transfer of Care (Signed)
Immediate Anesthesia Transfer of Care Note  Patient: Gina Adams  Procedure(s) Performed: Procedure(s): DILATATION AND EVACUATION (N/A)  Patient Location: PACU  Anesthesia Type:MAC  Level of Consciousness: awake, alert  and oriented  Airway & Oxygen Therapy: Patient Spontanous Breathing and Patient connected to nasal cannula oxygen  Post-op Assessment: Report given to RN, Post -op Vital signs reviewed and stable and Patient moving all extremities X 4  Post vital signs: Reviewed and stable  Last Vitals:  Filed Vitals:   08/08/15 1113  BP: 126/73  Pulse: 88  Temp: 36.5 C  Resp: 18    Complications: No apparent anesthesia complications

## 2015-08-09 NOTE — Op Note (Signed)
NAMEMarland Kitchen  Gina Adams, Gina Adams NO.:  [id number]  MEDICAL RECORD NO.:  [id number]  LOCATION:  [facility]                          FACILITY:  [facility]  PHYSICIAN:  Zelphia Cairo, MD    DATE OF BIRTH:  1991/05/06  DATE OF PROCEDURE:  08/08/2015 DATE OF DISCHARGE:  08/08/2015                              OPERATIVE REPORT   PREOPERATIVE DIAGNOSIS:  Missed abortion.  POSTOPERATIVE DIAGNOSIS:  Missed abortion.  PROCEDURE: 1. Paracervical block. 2. Dilation and evacuation.  SURGEON:  Zelphia Cairo, MD  ANESTHESIA:  MAC with local.  COMPLICATIONS:  None.  SPECIMEN:  Products of conception.  PROCEDURE IN DETAIL:  The patient was taken to the operating room after informed consent was obtained.  She was given anesthesia and placed in a dorsal lithotomy position using Allen stirrups.  She was prepped and draped in sterile fashion.  In-and-out catheter was used to drain her bladder for an unmeasured amount of urine.  A bivalve speculum was placed in the vagina and 1% lidocaine was injected at the 12 o'clock position of the cervix.  A single-tooth tenaculum was attached and the remaining 9 mL was used to perform a paracervical block.  The cervix was serially dilated using Pratt dilators and a 7-French suction catheter was introduced, products of conception were evacuated.  A gentle curetting was then performed until uterine cry was noted throughout. Suction catheter was reinserted to remove any clots and debris. Hemostasis was achieved.  The tenaculum was removed.  The cervix was hemostatic.  Speculum was removed.  She was repositioned and taken to the recovery room in stable condition.  Sponge, lap, needle, and instrument counts were correct x2.     Zelphia Cairo, MD     GA/MEDQ  D:  08/08/2015  T:  08/09/2015  Job:  [id number]

## 2015-08-10 ENCOUNTER — Other Ambulatory Visit (HOSPITAL_COMMUNITY)
Admission: AD | Admit: 2015-08-10 | Discharge: 2015-08-10 | Disposition: A | Discharge status: Home / Self Care | Payer: BLUE CROSS/BLUE SHIELD | Source: Ambulatory Visit | Attending: Obstetrics & Gynecology | Admitting: Obstetrics & Gynecology

## 2015-08-10 ENCOUNTER — Encounter (HOSPITAL_COMMUNITY): Payer: Self-pay | Admitting: Obstetrics and Gynecology

## 2015-08-10 LAB — RH IG WORKUP (INCLUDES ABO/RH)
ABO/RH(D): O NEG
Gestational Age(Wks): 6
UNIT DIVISION: 0

## 2015-08-10 NOTE — Addendum Note (Signed)
Addendum  created 08/10/15 1236 by Jhonnie Garner, CRNA   Modules edited: Charges VN

## 2015-08-11 ENCOUNTER — Encounter (HOSPITAL_COMMUNITY): Payer: Self-pay | Admitting: *Deleted

## 2015-08-11 ENCOUNTER — Inpatient Hospital Stay (HOSPITAL_COMMUNITY): Payer: BLUE CROSS/BLUE SHIELD

## 2015-08-11 ENCOUNTER — Inpatient Hospital Stay (HOSPITAL_COMMUNITY)
Admission: AD | Admit: 2015-08-11 | Discharge: 2015-08-11 | Disposition: A | Discharge status: Home / Self Care | Payer: BLUE CROSS/BLUE SHIELD | Source: Ambulatory Visit | Attending: Obstetrics & Gynecology | Admitting: Obstetrics & Gynecology

## 2015-08-11 DIAGNOSIS — R109 Unspecified abdominal pain: Secondary | ICD-10-CM

## 2015-08-11 DIAGNOSIS — N390 Urinary tract infection, site not specified: Secondary | ICD-10-CM | POA: Diagnosis not present

## 2015-08-11 DIAGNOSIS — R100 Acute abdomen: Secondary | ICD-10-CM | POA: Diagnosis not present

## 2015-08-11 DIAGNOSIS — F17210 Nicotine dependence, cigarettes, uncomplicated: Secondary | ICD-10-CM | POA: Insufficient documentation

## 2015-08-11 LAB — CBC
HEMATOCRIT: 39.1 % (ref 36.0–46.0)
HEMOGLOBIN: 13.5 g/dL (ref 12.0–15.0)
MCH: 31.8 pg (ref 26.0–34.0)
MCHC: 34.5 g/dL (ref 30.0–36.0)
MCV: 92 fL (ref 78.0–100.0)
Platelets: 264 10*3/uL (ref 150–400)
RBC: 4.25 MIL/uL (ref 3.87–5.11)
RDW: 12.5 % (ref 11.5–15.5)
WBC: 12.1 10*3/uL — ABNORMAL HIGH (ref 4.0–10.5)

## 2015-08-11 LAB — COMPREHENSIVE METABOLIC PANEL
ALBUMIN: 4.5 g/dL (ref 3.5–5.0)
ALK PHOS: 52 U/L (ref 38–126)
ALT: 10 U/L — ABNORMAL LOW (ref 14–54)
ANION GAP: 12 (ref 5–15)
AST: 16 U/L (ref 15–41)
BILIRUBIN TOTAL: 0.3 mg/dL (ref 0.3–1.2)
BUN: 12 mg/dL (ref 6–20)
CALCIUM: 9.1 mg/dL (ref 8.9–10.3)
CO2: 23 mmol/L (ref 22–32)
Chloride: 103 mmol/L (ref 101–111)
Creatinine, Ser: 0.57 mg/dL (ref 0.44–1.00)
GFR calc non Af Amer: >60 mL/min (ref >60)
GLUCOSE: 102 mg/dL — AB (ref 65–99)
Potassium: 3.9 mmol/L (ref 3.5–5.1)
Sodium: 138 mmol/L (ref 135–145)
TOTAL PROTEIN: 7.8 g/dL (ref 6.5–8.1)

## 2015-08-11 LAB — URINE MICROSCOPIC-ADD ON

## 2015-08-11 LAB — URINALYSIS, ROUTINE W REFLEX MICROSCOPIC
Bilirubin Urine: NEGATIVE
GLUCOSE, UA: NEGATIVE mg/dL
Ketones, ur: NEGATIVE mg/dL
LEUKOCYTES UA: NEGATIVE
Nitrite: NEGATIVE
PH: 5.5 (ref 5.0–8.0)
Protein, ur: NEGATIVE mg/dL
Urobilinogen, UA: 0.2 mg/dL (ref 0.0–1.0)

## 2015-08-11 MED ORDER — ONDANSETRON HCL 4 MG/2ML IJ SOLN
4.0000 mg | Freq: Once | INTRAMUSCULAR | Status: AC
  Administered 2015-08-11: 4 mg via INTRAVENOUS
  Filled 2015-08-11: qty 2

## 2015-08-11 MED ORDER — LACTATED RINGERS IV SOLN
Freq: Once | INTRAVENOUS | Status: AC
  Administered 2015-08-11: 19:00:00 via INTRAVENOUS

## 2015-08-11 MED ORDER — SODIUM CHLORIDE 0.9 % IV SOLN: 4.0000 mg | Freq: Once | INTRAVENOUS | Status: DC

## 2015-08-11 MED ORDER — HYDROMORPHONE HCL 1 MG/ML IJ SOLN
0.5000 mg | Freq: Once | INTRAMUSCULAR | Status: AC
  Administered 2015-08-11: 0.5 mg via INTRAVENOUS
  Filled 2015-08-11: qty 1

## 2015-08-11 MED ORDER — NITROFURANTOIN MONOHYD MACRO 100 MG PO CAPS: 100.0000 mg | ORAL_CAPSULE | Freq: Two times a day (BID) | ORAL | Status: DC

## 2015-08-11 NOTE — Discharge Instructions (Signed)

## 2015-08-11 NOTE — MAU Provider Note (Signed)
History     CSN: [id number]  Arrival date and time: 08/11/15 1844   First Provider Initiated Contact with Patient 08/11/15 1911      Chief Complaint  Patient presents with  . Post-op Problem   HPI Gina Adams 23 y.o.  Comes to MAU with severe lower abdominal pain which started today at 4 pm.  Hx of D&C on 08-08-15 for IUFD at [redacted] weeks gestation.  Pain has worsened since it started at 4 pm.  It is constant with periodic shooting pains.  Tried taking one pain pill at home at 4:30 pm.  It is not relieved the pain at all.  Unable to stand up straight.  Unable to lie down in bed.  Needs to recline.  Has not had vomiting.  Has tried pepto bismol and heating pad to lower abdomen.  Nothing has helped to relieve the pain.  Did notice some discomfort in the lower abdomen earlier today.  Has had numerous UTIs in the past and does not think it is a UTI.  Noticed slight dysuria once today.  OB History    Gravida Para Term Preterm AB TAB SAB Ectopic Multiple Living        Past Medical History  Diagnosis Date  . Panic     Past Surgical History  Procedure Laterality Date  . Tonsillectomy    . Diagnostic laparoscopy    . Dilation and evacuation N/A 08/08/2015    Procedure: DILATATION AND EVACUATION;  Surgeon: Zelphia Cairo, MD;  Location: [facility];  Service: Gynecology;  Laterality: N/A;    History reviewed. No pertinent family history.  Social History  Substance Use Topics  . Smoking status: Current Every Day Smoker -- 0.50 packs/day    Types: Cigarettes  . Smokeless tobacco: None  . Alcohol Use: Yes     Comment: occ    Allergies:  Allergies  Allergen Reactions  . Seroquel [Quetiapine] Other (See Comments)    Felt like she couldn't get out of bed, felt very weak    Prescriptions prior to admission  Medication Sig Dispense Refill Last Dose  . HYDROcodone-acetaminophen (NORCO) 5-325 MG per tablet Take 1-2 tablets by mouth every 6 (six) hours as needed for  moderate pain. 15 tablet 0 08/11/2015 at 1600  . ALPRAZolam (XANAX) 0.25 MG tablet Take 1 tablet (0.25 mg total) by mouth at bedtime as needed for anxiety. (Patient taking differently: Take 0.25 mg by mouth at bedtime as needed for anxiety or sleep. ) 10 tablet 0 08/08/2015 at 0900  . Cholecalciferol (VITAMIN D3) 1000 UNITS CAPS Take 1 capsule by mouth daily.   Unknown at Unknown time  . ibuprofen (ADVIL,MOTRIN) 800 MG tablet Take 800 mg by mouth every 8 (eight) hours as needed for headache.   More than a month at Unknown time  . Prenatal Vit-Fe Fumarate-FA (PRENATAL MULTIVITAMIN) TABS tablet Take 1 tablet by mouth daily at 12 noon.   Past Week at Unknown time  . sertraline (ZOLOFT) 25 MG tablet Take 25 mg by mouth daily.   Past Week at Unknown time    Review of Systems  Constitutional: Negative for fever.  Gastrointestinal: Positive for abdominal pain and diarrhea. Negative for nausea and vomiting.  Genitourinary:       No vaginal discharge. No vaginal bleeding. No dysuria.   Physical Exam   Blood pressure 153/99, pulse 112, temperature 98.7 F (37.1 C), temperature source Oral, resp. rate 18, height  5\' 3"  (1.6 m), weight 141 lb 6.4 oz (64.139 kg), last menstrual period 06/06/2015.  Physical Exam  Nursing note and vitals reviewed. Constitutional: She is oriented to person, place, and time. She appears well-developed and well-nourished.  Sitting with head of bed elevated.  HENT:  Head: Normocephalic.  Eyes: EOM are normal.  Neck: Neck supple.  Respiratory: Effort normal.  GI: Soft. There is tenderness. There is no rebound and no guarding.  Tender in low midline only.  Musculoskeletal: Normal range of motion.  Neurological: She is alert and oriented to person, place, and time.  Skin: Skin is warm and dry.  Psychiatric: She has a normal mood and affect.    MAU Course  Procedures Results for orders placed or performed during the hospital encounter of 08/11/15 (from the past 24  hour(s))  Urinalysis, Routine w reflex microscopic (not at [facility])     Status: Abnormal   Collection Time: 08/11/15  6:50 PM  Result Value Ref Range   Color, Urine YELLOW YELLOW   APPearance CLEAR CLEAR   Specific Gravity, Urine >1.030 (H) 1.005 - 1.030   pH 5.5 5.0 - 8.0   Glucose, UA NEGATIVE NEGATIVE mg/dL   Hgb urine dipstick SMALL (A) NEGATIVE   Bilirubin Urine NEGATIVE NEGATIVE   Ketones, ur NEGATIVE NEGATIVE mg/dL   Protein, ur NEGATIVE NEGATIVE mg/dL   Urobilinogen, UA 0.2 0.0 - 1.0 mg/dL   Nitrite NEGATIVE NEGATIVE   Leukocytes, UA NEGATIVE NEGATIVE  Urine microscopic-add on     Status: Abnormal   Collection Time: 08/11/15  6:50 PM  Result Value Ref Range   Squamous Epithelial / LPF FEW (A) RARE   WBC, UA 0-2 <3 WBC/hpf   RBC / HPF 7-10 <3 RBC/hpf   Bacteria, UA FEW (A) RARE   Urine-Other MUCOUS PRESENT   CBC     Status: Abnormal   Collection Time: 08/11/15  7:29 PM  Result Value Ref Range   WBC 12.1 (H) 4.0 - 10.5 K/uL   RBC 4.25 3.87 - 5.11 MIL/uL   Hemoglobin 13.5 12.0 - 15.0 g/dL   HCT 16.1 09.6 - 04.5 %   MCV 92.0 78.0 - 100.0 fL   MCH 31.8 26.0 - 34.0 pg   MCHC 34.5 30.0 - 36.0 g/dL   RDW 40.9 81.1 - 91.4 %   Platelets 264 150 - 400 K/uL  Comprehensive metabolic panel     Status: Abnormal   Collection Time: 08/11/15  7:29 PM  Result Value Ref Range   Sodium 138 135 - 145 mmol/L   Potassium 3.9 3.5 - 5.1 mmol/L   Chloride 103 101 - 111 mmol/L   CO2 23 22 - 32 mmol/L   Glucose, Bld 102 (H) 65 - 99 mg/dL   BUN 12 6 - 20 mg/dL   Creatinine, Ser 7.82 0.44 - 1.00 mg/dL   Calcium 9.1 8.9 - 95.6 mg/dL   Total Protein 7.8 6.5 - 8.1 g/dL   Albumin 4.5 3.5 - 5.0 g/dL   AST 16 15 - 41 U/L   ALT 10 (L) 14 - 54 U/L   Alkaline Phosphatase 52 38 - 126 U/L   Total Bilirubin 0.3 0.3 - 1.2 mg/dL   GFR calc non Af Amer >60 >60 mL/min   GFR calc Af Amer >60 >60 mL/min   Anion gap 12 5 - 15   Dg Abd 1 View  08/11/2015   CLINICAL DATA:  Postoperative pain, onset at  16:00  EXAM: ABDOMEN - 1 VIEW  COMPARISON:  None.  FINDINGS: The bowel gas pattern is normal. No radio-opaque calculi or other significant radiographic abnormality are seen.  IMPRESSION: Negative.   Electronically Signed   By: Ellery Plunk M.D.   On: 08/11/2015 21:03    MDM Consult with Dr. Langston Masker and received orders. Client is feeling much better after getting IV pain medication.  Pain is not completely gone but has reduced to 4/10. Called results to Dr. Langston Masker - Flat plate of abdomen ordered to rule out free air in the abdomen and results to be called to Dr. Langston Masker. Confirmed with client - Rhogam was given after D&C. Report given and care assumed by H. Mathews Robinsons, CNM at 2035. 2112: Reviewed X-ray with Dr. Langston Masker, ok for dc home will treat UTI with macrobid   Assessment and Plan   1. Abdominal pain, acute   2. UTI (lower urinary tract infection)    DC home Comfort measures reviewed  RX: Macrobid BID x 7 days  Return to MAU as needed FU with OB as planned  Follow-up Information    Follow up with MORRIS, MEGAN, DO.   Specialty:  Obstetrics and Gynecology   Why:  call for an appointment for Tuesday. If pain is worse call the office tomorrow to be seen there.    Contact information:   [address], [address] [place] [address] [phone]       Gina Adams  08/11/2015 9:13 PM   Adams,Gina 08/11/2015, 7:19 PM

## 2015-08-11 NOTE — MAU Note (Signed)
Pt had D&E on 8/29 for a miscarriage. Stated she has felt ok for the past few days and today all of a sudden she stared having severe pain not relived from pain medication.

## 2015-08-13 LAB — URINE CULTURE: Special Requests: NORMAL

## 2015-09-23 ENCOUNTER — Emergency Department (HOSPITAL_COMMUNITY): Payer: BLUE CROSS/BLUE SHIELD

## 2015-09-23 ENCOUNTER — Encounter (HOSPITAL_COMMUNITY): Payer: Self-pay | Admitting: Emergency Medicine

## 2015-09-23 ENCOUNTER — Emergency Department (HOSPITAL_COMMUNITY)
Admission: EM | Admit: 2015-09-23 | Discharge: 2015-09-24 | Discharge status: Against Medical Advice | Payer: BLUE CROSS/BLUE SHIELD | Attending: Emergency Medicine | Admitting: Emergency Medicine

## 2015-09-23 DIAGNOSIS — R079 Chest pain, unspecified: Secondary | ICD-10-CM | POA: Insufficient documentation

## 2015-09-23 DIAGNOSIS — R0602 Shortness of breath: Secondary | ICD-10-CM | POA: Insufficient documentation

## 2015-09-23 DIAGNOSIS — Z720 Tobacco use: Secondary | ICD-10-CM | POA: Insufficient documentation

## 2015-09-23 LAB — BASIC METABOLIC PANEL
Anion gap: 6 (ref 5–15)
BUN: 11 mg/dL (ref 6–20)
CALCIUM: 9.3 mg/dL (ref 8.9–10.3)
CO2: 27 mmol/L (ref 22–32)
Chloride: 105 mmol/L (ref 101–111)
Creatinine, Ser: 0.6 mg/dL (ref 0.44–1.00)
GFR calc Af Amer: >60 mL/min (ref >60)
GLUCOSE: 99 mg/dL (ref 65–99)
Potassium: 3.7 mmol/L (ref 3.5–5.1)
Sodium: 138 mmol/L (ref 135–145)

## 2015-09-23 LAB — CBC
HEMATOCRIT: 41.2 % (ref 36.0–46.0)
Hemoglobin: 14.2 g/dL (ref 12.0–15.0)
MCH: 32 pg (ref 26.0–34.0)
MCHC: 34.5 g/dL (ref 30.0–36.0)
MCV: 92.8 fL (ref 78.0–100.0)
Platelets: 272 10*3/uL (ref 150–400)
RBC: 4.44 MIL/uL (ref 3.87–5.11)
RDW: 12.7 % (ref 11.5–15.5)
WBC: 8.7 10*3/uL (ref 4.0–10.5)

## 2015-09-23 LAB — I-STAT TROPONIN, ED: TROPONIN I, POC: 0 ng/mL (ref 0.00–0.08)

## 2015-09-23 NOTE — ED Notes (Signed)
Patient c/o chest pain and sob onset about 1 hour ago. Patient states it started with burning in her left arm and chest, "it's always in my left". Patient states she has been seen for panic attacks and comes in always thinking its a heart attack. Patient states she took 3- 1/2 tabs of xanax, and she feels calm, but it has changed her other symptoms.

## 2015-09-24 NOTE — ED Notes (Signed)
Patient called to treatment room x2, no answer.  

## 2015-09-24 NOTE — ED Notes (Signed)
Patient called to treatment room, third call, no answer. Patient d/c LWBS at this time.

## 2016-05-14 ENCOUNTER — Ambulatory Visit (HOSPITAL_COMMUNITY)
Admission: EM | Admit: 2016-05-14 | Discharge: 2016-05-14 | Disposition: A | Discharge status: Home / Self Care | Payer: Managed Care, Other (non HMO) | Attending: Family Medicine | Admitting: Family Medicine

## 2016-05-14 ENCOUNTER — Encounter (HOSPITAL_COMMUNITY): Payer: Self-pay | Admitting: Family Medicine

## 2016-05-14 DIAGNOSIS — J40 Bronchitis, not specified as acute or chronic: Secondary | ICD-10-CM | POA: Diagnosis not present

## 2016-05-14 MED ORDER — ALBUTEROL SULFATE HFA 108 (90 BASE) MCG/ACT IN AERS: 2.0000 | INHALATION_SPRAY | RESPIRATORY_TRACT | Status: DC | PRN

## 2016-05-14 MED ORDER — AMOXICILLIN 500 MG PO CAPS: 500.0000 mg | ORAL_CAPSULE | Freq: Three times a day (TID) | ORAL | Status: DC

## 2016-05-14 MED ORDER — ALBUTEROL SULFATE (2.5 MG/3ML) 0.083% IN NEBU
INHALATION_SOLUTION | RESPIRATORY_TRACT | Status: AC
  Filled 2016-05-14: qty 3

## 2016-05-14 MED ORDER — ALBUTEROL SULFATE (2.5 MG/3ML) 0.083% IN NEBU
2.5000 mg | INHALATION_SOLUTION | Freq: Once | RESPIRATORY_TRACT | Status: AC
  Administered 2016-05-14: 2.5 mg via RESPIRATORY_TRACT

## 2016-05-14 NOTE — ED Provider Notes (Signed)
CSN: [id number]     Arrival date & time 05/14/16  1928 History   First MD Initiated Contact with Patient 05/14/16 1941     No chief complaint on file.  (Consider location/radiation/quality/duration/timing/severity/associated sxs/prior Treatment) Patient is a 25 y.o. female presenting with cough. The history is provided by the patient. No language interpreter was used.  Cough Severity:  Moderate Onset quality:  Unable to specify Duration:  7 days Timing:  Constant Progression:  Worsening Chronicity:  New Smoker: yes   Relieved by:  Decongestant Worsened by:  Nothing tried Ineffective treatments:  Decongestant Associated symptoms comment:  Feels as if she is sleep walking   Past Medical History  Diagnosis Date  . Panic    Past Surgical History  Procedure Laterality Date  . Tonsillectomy    . Diagnostic laparoscopy    . Dilation and evacuation N/A 08/08/2015    Procedure: DILATATION AND EVACUATION;  Surgeon: Zelphia Cairo, MD;  Location: [facility];  Service: Gynecology;  Laterality: N/A;   No family history on file. Social History  Substance Use Topics  . Smoking status: Current Every Day Smoker -- 0.15 packs/day    Types: Cigarettes  . Smokeless tobacco: Not on file  . Alcohol Use: Yes     Comment: occ   OB History    Gravida Para Term Preterm AB TAB SAB Ectopic Multiple Living       Review of Systems  Constitutional: Negative.   Eyes: Negative.   Respiratory: Positive for cough and chest tightness.   Cardiovascular: Negative.   Gastrointestinal: Negative.   Genitourinary: Negative.   Musculoskeletal: Negative.   Skin: Negative.   Neurological: Negative.   Psychiatric/Behavioral: Negative.     Allergies  Seroquel  Home Medications   Prior to Admission medications   Medication Sig Start Date End Date Taking? Authorizing Provider  ALPRAZolam (XANAX) 0.25 MG tablet Take 1 tablet (0.25 mg total) by mouth at bedtime as needed for  anxiety. Patient taking differently: Take 0.25 mg by mouth at bedtime as needed for anxiety or sleep.  01/20/15   Loren Racer, MD  Cholecalciferol (VITAMIN D3) 1000 UNITS CAPS Take 1 capsule by mouth daily.    Historical Provider, MD  HYDROcodone-acetaminophen (NORCO) 5-325 MG per tablet Take 1-2 tablets by mouth every 6 (six) hours as needed for moderate pain. 08/08/15   Zelphia Cairo, MD  ibuprofen (ADVIL,MOTRIN) 800 MG tablet Take 800 mg by mouth every 8 (eight) hours as needed for headache.    Historical Provider, MD  nitrofurantoin, macrocrystal-monohydrate, (MACROBID) 100 MG capsule Take 1 capsule (100 mg total) by mouth 2 (two) times daily. 08/11/15   Armando Reichert, CNM  Prenatal Vit-Fe Fumarate-FA (PRENATAL MULTIVITAMIN) TABS tablet Take 1 tablet by mouth daily at 12 noon.    Historical Provider, MD  sertraline (ZOLOFT) 25 MG tablet Take 25 mg by mouth daily.    Historical Provider, MD   Meds Ordered and Administered this Visit   Medications  albuterol (PROVENTIL) (2.5 MG/3ML) 0.083% nebulizer solution 2.5 mg (2.5 mg Nebulization Given 05/14/16 2005)  Patient states no improvement after nebulizer treatment.  There were no vitals taken for this visit. No data found.   Physical Exam  Constitutional: She is oriented to person, place, and time. She appears well-developed and well-nourished.  HENT:  Head: Normocephalic and atraumatic.  Neck: Normal range of motion. Neck supple.  Cardiovascular: Normal rate, regular rhythm and normal heart sounds.  Pulmonary/Chest: Effort normal. She has wheezes.  Diffuse without consolidation or crackles  Abdominal: Soft.  Musculoskeletal: Normal range of motion.  Neurological: She is alert and oriented to person, place, and time.  Skin: Skin is warm and dry.  Psychiatric: She has a normal mood and affect. Her behavior is normal.  Nursing note and vitals reviewed.   ED Course  Procedures (including critical care time)  Labs Review Labs  Reviewed - No data to display  Imaging Review No results found.   Visual Acuity Review  Right Eye Distance:   Left Eye Distance:   Bilateral Distance:    Right Eye Near:   Left Eye Near:    Bilateral Near:      Ascription for amoxicillin and albuterol is provided to the patient. Expect full recovery from this illness.   MDM   1. Bronchitis     Patient is reassured that there are no issues that require transfer to higher level of care at this time or additional tests. Patient is advised to continue home symptomatic treatment. Patient is advised that if there are new or worsening symptoms to attend the emergency department, contact primary care provider, or return to UC. Instructions of care provided discharged home in stable condition.    THIS NOTE WAS GENERATED USING A VOICE RECOGNITION SOFTWARE PROGRAM. ALL REASONABLE EFFORTS  WERE MADE TO PROOFREAD THIS DOCUMENT FOR ACCURACY.  I have verbally reviewed the discharge instructions with the patient. A printed AVS was given to the patient.  All questions were answered prior to discharge.      Tharon Aquas, PA 05/16/16 1319

## 2016-05-14 NOTE — ED Notes (Signed)
Pt here for cough, congestion x 2 weeks and no relief with mucinex and sudafed. sts hurts in her chest when she breathes.

## 2016-05-14 NOTE — Discharge Instructions (Signed)
How to Use an Inhaler Proper inhaler technique is very important. Good technique ensures that the medicine reaches the lungs. Poor technique results in depositing the medicine on the tongue and back of the throat rather than in the airways. If you do not use the inhaler with good technique, the medicine will not help you. STEPS TO FOLLOW IF USING AN INHALER WITHOUT AN EXTENSION TUBE  Remove the cap from the inhaler.  If you are using the inhaler for the first time, you will need to prime it. Shake the inhaler for 5 seconds and release four puffs into the air, away from your face. Ask your health care provider or pharmacist if you have questions about priming your inhaler.  Shake the inhaler for 5 seconds before each breath in (inhalation).  Position the inhaler so that the top of the canister faces up.  Put your index finger on the top of the medicine canister. Your thumb supports the bottom of the inhaler.  Open your mouth.  Either place the inhaler between your teeth and place your lips tightly around the mouthpiece, or hold the inhaler 1-2 inches away from your open mouth. If you are unsure of which technique to use, ask your health care provider.  Breathe out (exhale) normally and as completely as possible.  Press the canister down with your index finger to release the medicine.  At the same time as the canister is pressed, inhale deeply and slowly until your lungs are completely filled. This should take 4-6 seconds. Keep your tongue down.  Hold the medicine in your lungs for 5-10 seconds (10 seconds is best). This helps the medicine get into the small airways of your lungs.  Breathe out slowly, through pursed lips. Whistling is an example of pursed lips.  Wait at least 15-30 seconds between puffs. Continue with the above steps until you have taken the number of puffs your health care provider has ordered. Do not use the inhaler more than your health care provider tells  you.  Replace the cap on the inhaler.  Follow the directions from your health care provider or the inhaler insert for cleaning the inhaler. STEPS TO FOLLOW IF USING AN INHALER WITH AN EXTENSION (SPACER)  Remove the cap from the inhaler.  If you are using the inhaler for the first time, you will need to prime it. Shake the inhaler for 5 seconds and release four puffs into the air, away from your face. Ask your health care provider or pharmacist if you have questions about priming your inhaler.  Shake the inhaler for 5 seconds before each breath in (inhalation).  Place the open end of the spacer onto the mouthpiece of the inhaler.  Position the inhaler so that the top of the canister faces up and the spacer mouthpiece faces you.  Put your index finger on the top of the medicine canister. Your thumb supports the bottom of the inhaler and the spacer.  Breathe out (exhale) normally and as completely as possible.  Immediately after exhaling, place the spacer between your teeth and into your mouth. Close your lips tightly around the spacer.  Press the canister down with your index finger to release the medicine.  At the same time as the canister is pressed, inhale deeply and slowly until your lungs are completely filled. This should take 4-6 seconds. Keep your tongue down and out of the way.  Hold the medicine in your lungs for 5-10 seconds (10 seconds is best). This helps the  medicine get into the small airways of your lungs. Exhale.  Repeat inhaling deeply through the spacer mouthpiece. Again hold that breath for up to 10 seconds (10 seconds is best). Exhale slowly. If it is difficult to take this second deep breath through the spacer, breathe normally several times through the spacer. Remove the spacer from your mouth.  Wait at least 15-30 seconds between puffs. Continue with the above steps until you have taken the number of puffs your health care provider has ordered. Do not use the  inhaler more than your health care provider tells you.  Remove the spacer from the inhaler, and place the cap on the inhaler.  Follow the directions from your health care provider or the inhaler insert for cleaning the inhaler and spacer. If you are using different kinds of inhalers, use your quick relief medicine to open the airways 10-15 minutes before using a steroid if instructed to do so by your health care provider. If you are unsure which inhalers to use and the order of using them, ask your health care provider, nurse, or respiratory therapist. If you are using a steroid inhaler, always rinse your mouth with water after your last puff, then gargle and spit out the water. Do not swallow the water. AVOID:  Inhaling before or after starting the spray of medicine. It takes practice to coordinate your breathing with triggering the spray.  Inhaling through the nose (rather than the mouth) when triggering the spray. HOW TO DETERMINE IF YOUR INHALER IS FULL OR NEARLY EMPTY You cannot know when an inhaler is empty by shaking it. A few inhalers are now being made with dose counters. Ask your health care provider for a prescription that has a dose counter if you feel you need that extra help. If your inhaler does not have a counter, ask your health care provider to help you determine the date you need to refill your inhaler. Write the refill date on a calendar or your inhaler canister. Refill your inhaler 7-10 days before it runs out. Be sure to keep an adequate supply of medicine. This includes making sure it is not expired, and that you have a spare inhaler.  SEEK MEDICAL CARE IF:   Your symptoms are only partially relieved with your inhaler.  You are having trouble using your inhaler.  You have some increase in phlegm. SEEK IMMEDIATE MEDICAL CARE IF:   You feel little or no relief with your inhalers. You are still wheezing and are feeling shortness of breath or tightness in your chest or  both.  You have dizziness, headaches, or a fast heart rate.  You have chills, fever, or night sweats.  You have a noticeable increase in phlegm production, or there is blood in the phlegm. MAKE SURE YOU:   Understand these instructions.  Will watch your condition.  Will get help right away if you are not doing well or get worse.   This information is not intended to replace advice given to you by your health care provider. Make sure you discuss any questions you have with your health care provider.   Document Released: 11/23/2000 Document Revised: 09/16/2013 Document Reviewed: 06/25/2013 Elsevier Interactive Patient Education 2016 Else[employer] Inc[employer]  Upper Respiratory Infection, Adult Most upper respiratory infections (URIs) are a viral infection of the air passages leading to the lungs. A URI affects the nose, throat, and upper air passages. The most common type of URI is nasopharyngitis and is typically referred to as "the common cold."  URIs run their course and usually go away on their own. Most of the time, a URI does not require medical attention, but sometimes a bacterial infection in the upper airways can follow a viral infection. This is called a secondary infection. Sinus and middle ear infections are common types of secondary upper respiratory infections. Bacterial pneumonia can also complicate a URI. A URI can worsen asthma and chronic obstructive pulmonary disease (COPD). Sometimes, these complications can require emergency medical care and may be life threatening.  CAUSES Almost all URIs are caused by viruses. A virus is a type of germ and can spread from one person to another.  RISKS FACTORS You may be at risk for a URI if:   You smoke.   You have chronic heart or lung disease.  You have a weakened defense (immune) system.   You are very young or very old.   You have nasal allergies or asthma.  You work in crowded or poorly ventilated areas.  You work in health  care facilities or schools. SIGNS AND SYMPTOMS  Symptoms typically develop 2-3 days after you come in contact with a cold virus. Most viral URIs last 7-10 days. However, viral URIs from the influenza virus (flu virus) can last 14-18 days and are typically more severe. Symptoms may include:   Runny or stuffy (congested) nose.   Sneezing.   Cough.   Sore throat.   Headache.   Fatigue.   Fever.   Loss of appetite.   Pain in your forehead, behind your eyes, and over your cheekbones (sinus pain).  Muscle aches.  DIAGNOSIS  Your health care provider may diagnose a URI by:  Physical exam.  Tests to check that your symptoms are not due to another condition such as:  Strep throat.  Sinusitis.  Pneumonia.  Asthma. TREATMENT  A URI goes away on its own with time. It cannot be cured with medicines, but medicines may be prescribed or recommended to relieve symptoms. Medicines may help:  Reduce your fever.  Reduce your cough.  Relieve nasal congestion. HOME CARE INSTRUCTIONS   Take medicines only as directed by your health care provider.   Gargle warm saltwater or take cough drops to comfort your throat as directed by your health care provider.  Use a warm mist humidifier or inhale steam from a shower to increase air moisture. This may make it easier to breathe.  Drink enough fluid to keep your urine clear or pale yellow.   Eat soups and other clear broths and maintain good nutrition.   Rest as needed.   Return to work when your temperature has returned to normal or as your health care provider advises. You may need to stay home longer to avoid infecting others. You can also use a face mask and careful hand washing to prevent spread of the virus.  Increase the usage of your inhaler if you have asthma.   Do not use any tobacco products, including cigarettes, chewing tobacco, or electronic cigarettes. If you need help quitting, ask your health care  provider. PREVENTION  The best way to protect yourself from getting a cold is to practice good hygiene.   Avoid oral or hand contact with people with cold symptoms.   Wash your hands often if contact occurs.  There is no clear evidence that vitamin C, vitamin E, echinacea, or exercise reduces the chance of developing a cold. However, it is always recommended to get plenty of rest, exercise, and practice good nutrition.  SEEK MEDICAL CARE IF:   You are getting worse rather than better.   Your symptoms are not controlled by medicine.   You have chills.  You have worsening shortness of breath.  You have brown or red mucus.  You have yellow or brown nasal discharge.  You have pain in your face, especially when you bend forward.  You have a fever.  You have swollen neck glands.  You have pain while swallowing.  You have white areas in the back of your throat. SEEK IMMEDIATE MEDICAL CARE IF:   You have severe or persistent:  Headache.  Ear pain.  Sinus pain.  Chest pain.  You have chronic lung disease and any of the following:  Wheezing.  Prolonged cough.  Coughing up blood.  A change in your usual mucus.  You have a stiff neck.  You have changes in your:  Vision.  Hearing.  Thinking.  Mood. MAKE SURE YOU:   Understand these instructions.  Will watch your condition.  Will get help right away if you are not doing well or get worse.   This information is not intended to replace advice given to you by your health care provider. Make sure you discuss any questions you have with your health care provider.   Document Released: 05/22/2001 Document Revised: 04/12/2015 Document Reviewed: 03/03/2014 Elsevier Interactive Patient Education 2016 Elsevier Inc. Acute Bronchitis Bronchitis is when the airways that extend from the windpipe into the lungs get red, puffy, and painful (inflamed). Bronchitis often causes thick spit (mucus) to develop. This leads  to a cough. A cough is the most common symptom of bronchitis. In acute bronchitis, the condition usually begins suddenly and goes away over time (usually in 2 weeks). Smoking, allergies, and asthma can make bronchitis worse. Repeated episodes of bronchitis may cause more lung problems. HOME CARE  Rest.  Drink enough fluids to keep your pee (urine) clear or pale yellow (unless you need to limit fluids as told by your doctor).  Only take over-the-counter or prescription medicines as told by your doctor.  Avoid smoking and secondhand smoke. These can make bronchitis worse. If you are a smoker, think about using nicotine gum or skin patches. Quitting smoking will help your lungs heal faster.  Reduce the chance of getting bronchitis again by:  Washing your hands often.  Avoiding people with cold symptoms.  Trying not to touch your hands to your mouth, nose, or eyes.  Follow up with your doctor as told. GET HELP IF: Your symptoms do not improve after 1 week of treatment. Symptoms include:  Cough.  Fever.  Coughing up thick spit.  Body aches.  Chest congestion.  Chills.  Shortness of breath.  Sore throat. GET HELP RIGHT AWAY IF:   You have an increased fever.  You have chills.  You have severe shortness of breath.  You have bloody thick spit (sputum).  You throw up (vomit) often.  You lose too much body fluid (dehydration).  You have a severe headache.  You faint. MAKE SURE YOU:   Understand these instructions.  Will watch your condition.  Will get help right away if you are not doing well or get worse.   This information is not intended to replace advice given to you by your health care provider. Make sure you discuss any questions you have with your health care provider.   Document Released: 05/14/2008 Document Revised: 07/29/2013 Document Reviewed: 05/19/2013 Elsevier Interactive Patient Education [employer]

## 2016-05-27 ENCOUNTER — Emergency Department (HOSPITAL_COMMUNITY)
Admission: EM | Admit: 2016-05-27 | Discharge: 2016-05-28 | Disposition: A | Discharge status: Home / Self Care | Payer: Managed Care, Other (non HMO) | Attending: Emergency Medicine | Admitting: Emergency Medicine

## 2016-05-27 ENCOUNTER — Other Ambulatory Visit: Payer: Self-pay

## 2016-05-27 ENCOUNTER — Emergency Department (HOSPITAL_COMMUNITY): Payer: Managed Care, Other (non HMO)

## 2016-05-27 ENCOUNTER — Encounter (HOSPITAL_COMMUNITY): Payer: Self-pay | Admitting: *Deleted

## 2016-05-27 DIAGNOSIS — F1290 Cannabis use, unspecified, uncomplicated: Secondary | ICD-10-CM | POA: Diagnosis not present

## 2016-05-27 DIAGNOSIS — R079 Chest pain, unspecified: Secondary | ICD-10-CM | POA: Diagnosis present

## 2016-05-27 DIAGNOSIS — Z79899 Other long term (current) drug therapy: Secondary | ICD-10-CM | POA: Insufficient documentation

## 2016-05-27 DIAGNOSIS — F419 Anxiety disorder, unspecified: Secondary | ICD-10-CM | POA: Insufficient documentation

## 2016-05-27 DIAGNOSIS — F17210 Nicotine dependence, cigarettes, uncomplicated: Secondary | ICD-10-CM | POA: Insufficient documentation

## 2016-05-27 DIAGNOSIS — R0981 Nasal congestion: Secondary | ICD-10-CM | POA: Diagnosis not present

## 2016-05-27 DIAGNOSIS — Z792 Long term (current) use of antibiotics: Secondary | ICD-10-CM | POA: Diagnosis not present

## 2016-05-27 NOTE — ED Notes (Signed)
Pt states she has felt dizziness, intermittent chest pain and facial pain since 12pm today. Pt denies nausea/vomiting.

## 2016-05-28 MED ORDER — FLUTICASONE PROPIONATE 50 MCG/ACT NA SUSP
2.0000 | Freq: Every day | NASAL | Status: DC
  Administered 2016-05-28: 2 via NASAL
  Filled 2016-05-28: qty 16

## 2016-05-28 MED ORDER — LORAZEPAM 1 MG PO TABS
1.0000 mg | ORAL_TABLET | Freq: Once | ORAL | Status: AC
  Administered 2016-05-28: 1 mg via ORAL
  Filled 2016-05-28: qty 1

## 2016-05-28 MED ORDER — LORAZEPAM 0.5 MG PO TABS: 0.5000 mg | ORAL_TABLET | Freq: Two times a day (BID) | ORAL | Status: DC | PRN

## 2016-05-28 NOTE — ED Provider Notes (Signed)
CSN: [id number]     Arrival date & time 05/27/16  2135 History   First MD Initiated Contact with Patient 05/27/16 2341     Chief Complaint  Patient presents with  . Facial Pain  . Dizziness  . Chest Pain     (Consider location/radiation/quality/duration/timing/severity/associated sxs/prior Treatment) HPI Comments: This is a 25 year old female with a history of anxiety who has been ill with nasal congestion, dizziness, panic attacks for the past 3 weeks.  She was seen at urgent care several weeks ago and given antibiotics and inhaler, and diagnosed with bronchitis.  She states that has gotten significantly better but she still is having intermittent nasal congestion and head fullness.  She states that she is very stressed at her job.  She is trying to plan a wedding and this is becoming overwhelming.  She does not have time to take off from work to see her primary care physician  Patient is a 25 y.o. female presenting with dizziness and chest pain. The history is provided by the patient.  Dizziness Quality:  Unable to specify Severity:  Mild Onset quality:  Gradual Timing:  Intermittent Progression:  Waxing and waning Chronicity:  Recurrent Relieved by:  Nothing Worsened by:  Nothing Associated symptoms: chest pain   Associated symptoms: no shortness of breath   Chest Pain Associated symptoms: cough and dizziness   Associated symptoms: no fever and no shortness of breath     Past Medical History  Diagnosis Date  . Panic    Past Surgical History  Procedure Laterality Date  . Tonsillectomy    . Diagnostic laparoscopy    . Dilation and evacuation N/A 08/08/2015    Procedure: DILATATION AND EVACUATION;  Surgeon: Zelphia Cairo, MD;  Location: [facility];  Service: Gynecology;  Laterality: N/A;   No family history on file. Social History  Substance Use Topics  . Smoking status: Current Every Day Smoker -- 0.15 packs/day    Types: Cigarettes  . Smokeless tobacco: None  . Alcohol  Use: Yes     Comment: occ   OB History    Gravida Para Term Preterm AB TAB SAB Ectopic Multiple Living       Review of Systems  Constitutional: Negative for fever and chills.  HENT: Positive for congestion, postnasal drip and rhinorrhea.   Respiratory: Positive for cough. Negative for shortness of breath.   Cardiovascular: Positive for chest pain.  Neurological: Positive for dizziness. Negative for light-headedness.  All other systems reviewed and are negative.     Allergies  Seroquel  Home Medications   Prior to Admission medications   Medication Sig Start Date End Date Taking? Authorizing Provider  albuterol (PROVENTIL HFA;VENTOLIN HFA) 108 (90 Base) MCG/ACT inhaler Inhale 2 puffs into the lungs every 4 (four) hours as needed for wheezing or shortness of breath. 05/14/16   Tharon Aquas, PA  ALPRAZolam Prudy Feeler) 0.25 MG tablet Take 1 tablet (0.25 mg total) by mouth at bedtime as needed for anxiety. Patient taking differently: Take 0.25 mg by mouth at bedtime as needed for anxiety or sleep.  01/20/15   Loren Racer, MD  amoxicillin (AMOXIL) 500 MG capsule Take 1 capsule (500 mg total) by mouth 3 (three) times daily. 05/14/16   Tharon Aquas, PA  Cholecalciferol (VITAMIN D3) 1000 UNITS CAPS Take 1 capsule by mouth daily.    Historical Provider, MD  HYDROcodone-acetaminophen (NORCO) 5-325 MG per tablet Take 1-2 tablets by mouth  every 6 (six) hours as needed for moderate pain. 08/08/15   Zelphia Cairo, MD  ibuprofen (ADVIL,MOTRIN) 800 MG tablet Take 800 mg by mouth every 8 (eight) hours as needed for headache.    Historical Provider, MD  LORazepam (ATIVAN) 0.5 MG tablet Take 1 tablet (0.5 mg total) by mouth 2 (two) times daily as needed for anxiety. 05/28/16   Earley Favor, NP  nitrofurantoin, macrocrystal-monohydrate, (MACROBID) 100 MG capsule Take 1 capsule (100 mg total) by mouth 2 (two) times daily. 08/11/15   Armando Reichert, CNM  Prenatal Vit-Fe Fumarate-FA  (PRENATAL MULTIVITAMIN) TABS tablet Take 1 tablet by mouth daily at 12 noon.    Historical Provider, MD  sertraline (ZOLOFT) 25 MG tablet Take 25 mg by mouth daily.    Historical Provider, MD   BP 140/97 mmHg  Pulse 103  Temp(Src) 98.4 F (36.9 C) (Oral)  Resp 18  SpO2 98%  LMP 05/14/2016 Physical Exam  Constitutional: She appears well-developed and well-nourished.  HENT:  Head: Normocephalic.  Nose: Mucosal edema and rhinorrhea present. No sinus tenderness.  Mouth/Throat: Oropharynx is clear and moist.  Eyes: Pupils are equal, round, and reactive to light.  Neck: Normal range of motion.  Cardiovascular: Regular rhythm.  Tachycardia present.   Pulmonary/Chest: Effort normal and breath sounds normal.  Musculoskeletal: Normal range of motion.  Neurological: She is alert.  Skin: Skin is warm and dry.  Nursing note and vitals reviewed.   ED Course  Procedures (including critical care time) Labs Review Labs Reviewed - No data to display  Imaging Review Dg Chest 2 View  05/27/2016  CLINICAL DATA:  Acute onset of dizziness and generalized chest pain. Facial pain. Initial encounter. EXAM: CHEST  2 VIEW COMPARISON:  None. FINDINGS: The lungs are well-aerated and clear. There is no evidence of focal opacification, pleural effusion or pneumothorax. The heart is normal in size; the mediastinal contour is within normal limits. No acute osseous abnormalities are seen. IMPRESSION: No acute cardiopulmonary process seen. Electronically Signed   By: Roanna Raider M.D.   On: 05/27/2016 23:56   I have personally reviewed and evaluated these images and lab results as part of my medical decision-making.   EKG Interpretation None    Will give the patient is short course of Ativan for her anxiety.  I encouraged her to follow-up with her primary care physician, have also provided her with a Flonase nasal inhaler for her congestion  MDM   Final diagnoses:  Nasal congestion  Anxiety          Earley Favor, NP 05/28/16 2340  Leta Baptist, MD 05/30/16 [phone]

## 2016-05-28 NOTE — Discharge Instructions (Signed)
You have been given a prescription for Ativan.  Please uses as needed for anxiety/panic attacks .  You've also been provided with a Flonase inhaler.  Please use this for ear congestion .  Make an appointment with your primary care physician as needed.  For further evaluation   Panic Attacks Panic attacks are sudden, short feelings of great fear or discomfort. You may have them for no reason when you are relaxed, when you are uneasy (anxious), or when you are sleeping.  HOME CARE  Take all your medicines as told.  Check with your doctor before starting new medicines.  Keep all doctor visits. GET HELP IF:  You are not able to take your medicines as told.  Your symptoms do not get better.  Your symptoms get worse. GET HELP RIGHT AWAY IF:  Your attacks seem different than your normal attacks.  You have thoughts about hurting yourself or others.  You take panic attack medicine and you have a side effect. MAKE SURE YOU:  Understand these instructions.  Will watch your condition.  Will get help right away if you are not doing well or get worse.   This information is not intended to replace advice given to you by your health care provider. Make sure you discuss any questions you have with your health care provider.   Document Released: 12/29/2010 Document Revised: 09/16/2013 Document Reviewed: 07/10/2013 Elsevier Interactive Patient Education [employer]

## 2016-12-21 ENCOUNTER — Encounter: Payer: Self-pay | Admitting: Gynecology

## 2016-12-21 ENCOUNTER — Ambulatory Visit (INDEPENDENT_AMBULATORY_CARE_PROVIDER_SITE_OTHER): Payer: BLUE CROSS/BLUE SHIELD | Admitting: Gynecology

## 2016-12-21 VITALS — BP 124/80 | Ht 63.0 in | Wt 140.0 lb

## 2016-12-21 DIAGNOSIS — Z8742 Personal history of other diseases of the female genital tract: Secondary | ICD-10-CM | POA: Diagnosis not present

## 2016-12-21 DIAGNOSIS — R102 Pelvic and perineal pain: Secondary | ICD-10-CM | POA: Diagnosis not present

## 2016-12-21 DIAGNOSIS — F17200 Nicotine dependence, unspecified, uncomplicated: Secondary | ICD-10-CM

## 2016-12-21 MED ORDER — DOXYCYCLINE HYCLATE 100 MG PO CAPS: 100.0000 mg | ORAL_CAPSULE | Freq: Two times a day (BID) | ORAL | 0 refills | Status: DC

## 2016-12-21 NOTE — Progress Notes (Signed)
   26 year old gravida 1 para 0 AB 1 who presented to the office today as a new patient with several complaints. Patient with past history of endometriosis and pelvic pain and had a miscarriage in 2016. Patient reports normal menstrual cycles she stopped the birth control pill 3 months ago. She stated that when she was in her 36s she had a laparoscopy in [facility] and was diagnosed with endometriosis and had laser treatment. She was placed on Lupron treatment with shots every 3 months 2. She smokes one pack cigarette per day. She has deep thrust dyspareunia and bloating and pelvic pain especially right before her menses. Patient is contemplating getting pregnant with her partner which she got married last year and would like to attempt pregnancy at the end of this year. 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 She denies any STDs in the past.  Exam: Abdomen: Soft nontender no rebound or guarding Pelvic: Bartholin urethra Skene was within normal limits Vagina: No lesions or discharge Cervix: No lesions or discharge Uterus: Anteverted upper limits of normal slightly tenderness no rebound or guarding adnexa: Slight tenderness but no masses or tenderness Rectal exam not done  26 year old with past history of endometriosis with flareups now that she stop the oral contraceptive pill. Since she is contemplating getting pregnant at the end of the year I'm going to recommend Lupron 11.25 mg IM every 3 months 2 before attempting pregnancy. We're also going to obtain a copy of the operative note from the hospital in [place] which she had her laparoscopy and endometriosis had been diagnosed. She was counseled on the detrimental effects of smoking. I've recommended she start taking prenatal vitamins daily but during the time that she is on the Lupron that she should use barrier contraception. In the meantime we are going to  proceed with an HSG at the time of her next cycle to see of her tubes are patent and her husband to obtain a semen analysis. If her tubes are blocked because her history of endometriosis at the end of the year we will need to refer her to the reproductive endocrinologist to consider in vitro fertilization. She will be prescribed Vibramycin 100 mg take 1 by mouth twice a day for 3 days starting the day before the HSG. She will call the office when she starts her cycle so that we can start her on the Lupron.  Time of consultation 30 minutes

## 2016-12-21 NOTE — Patient Instructions (Addendum)
Endometriosis Endometriosis is a condition in which the tissue that lines the uterus (endometrium) grows outside of its normal location. The tissue may grow in many locations close to the uterus, but it commonly grows on the ovaries, fallopian tubes, vagina, or bowel. When the uterus sheds the endometrium every menstrual cycle, there is bleeding wherever the endometrial tissue is located. This can cause pain because blood is irritating to tissues that are not normally exposed to it. What are the causes? The cause of endometriosis is not known. What increases the risk? You may be more likely to develop endometriosis if you:  Have a family history of endometriosis.  Have never given birth.  Started your period at age 10 or younger.  Have high levels of estrogen in your body.  Were exposed to a certain medicine (diethylstilbestrol) before you were born (in utero).  Had low birth weight.  Were born as a twin, triplet, or other multiple.  Have a BMI of less than 25. BMI is an estimate of body fat and is calculated from height and weight. What are the signs or symptoms? Often, there are no symptoms of this condition. If you do have symptoms, they may:  Vary depending on where your endometrial tissue is growing.  Occur during your menstrual period (most common) or midcycle.  Come and go, or you may go months with no symptoms at all.  Stop with menopause. Symptoms may include:  Pain in the back or abdomen.  Heavier bleeding during periods.  Pain during sex.  Painful bowel movements.  Infertility.  Pelvic pain.  Bleeding more than once a month. How is this diagnosed? This condition is diagnosed based on your symptoms and a physical exam. You may have tests, such as:  Blood tests and urine tests. These may be done to help rule out other possible causes of your symptoms.  Ultrasound, to look for abnormal tissues.  An X-ray of the lower bowel (barium enema).  An ultrasound  that is done through the vagina (transvaginally).  CT scan.  MRI.  Laparoscopy. In this procedure, a lighted, pencil-sized instrument called a laparoscope is inserted into your abdomen through an incision. The laparoscope allows your health care provider to look at the organs inside your body and check for abnormal tissue to confirm the diagnosis. If abnormal tissue is found, your health care provider may remove a small piece of tissue (biopsy) to be examined under a microscope. How is this treated? Treatment for this condition may include:  Medicines to relieve pain, such as NSAIDs.  Hormone therapy. This involves using artificial (synthetic) hormones to reduce endometrial tissue growth. Your health care provider may recommend using a hormonal form of birth control, or other medicines.  Surgery. This may be done to remove abnormal endometrial tissue.  In some cases, tissue may be removed using a laparoscope and a laser (laparoscopic laser treatment).  In severe cases, surgery may be done to remove the fallopian tubes, uterus, and ovaries (hysterectomy). Follow these instructions at home:  Take over-the-counter and prescription medicines only as told by your health care provider.  Do not drive or use heavy machinery while taking prescription pain medicine.  Try to avoid activities that cause pain, including sexual activity.  Keep all follow-up visits as told by your health care provider. This is important. Contact a health care provider if:  You have pain in the area between your hip bones (pelvic area) that occurs:  Before, during, or after your period.  In   between your period and gets worse during your period.  During or after sex.  With bowel movements or urination, especially during your period.  You have problems getting pregnant.  You have a fever. Get help right away if:  You have severe pain that does not get better with medicine.  You have severe nausea and  vomiting, or you cannot eat without vomiting.  You have pain that affects only the lower, right side of your abdomen.  You have abdominal pain that gets worse.  You have abdominal swelling.  You have blood in your stool. This information is not intended to replace advice given to you by your health care provider. Make sure you discuss any questions you have with your health care provider. Document Released: 11/23/2000 Document Revised: 08/31/2016 Document Reviewed: 04/28/2016 Elsevier Interactive Patient Education  2017 [employer].  Steps to Quit Smoking Smoking tobacco can be harmful to your health and can affect almost every organ in your body. Smoking puts you, and those around you, at risk for developing many serious chronic diseases. Quitting smoking is difficult, but it is one of the best things that you can do for your health. It is never too late to quit. What are the benefits of quitting smoking? When you quit smoking, you lower your risk of developing serious diseases and conditions, such as:  Lung cancer or lung disease, such as COPD.  Heart disease.  Stroke.  Heart attack.  Infertility.  Osteoporosis and bone fractures. Additionally, symptoms such as coughing, wheezing, and shortness of breath may get better when you quit. You may also find that you get sick less often because your body is stronger at fighting off colds and infections. If you are pregnant, quitting smoking can help to reduce your chances of having a baby of low birth weight. How do I get ready to quit? When you decide to quit smoking, create a plan to make sure that you are successful. Before you quit:  Pick a date to quit. Set a date within the next two weeks to give you time to prepare.  Write down the reasons why you are quitting. Keep this list in places where you will see it often, such as on your bathroom mirror or in your car or wallet.  Identify the people, places, things, and activities  that make you want to smoke (triggers) and avoid them. Make sure to take these actions:  Throw away all cigarettes at home, at work, and in your car.  Throw away smoking accessories, such as Set designer.  Clean your car and make sure to empty the ashtray.  Clean your home, including curtains and carpets.  Tell your family, friends, and coworkers that you are quitting. Support from your loved ones can make quitting easier.  Talk with your health care provider about your options for quitting smoking.  Find out what treatment options are covered by your health insurance. What strategies can I use to quit smoking? Talk with your healthcare provider about different strategies to quit smoking. Some strategies include:  Quitting smoking altogether instead of gradually lessening how much you smoke over a period of time. Research shows that quitting "cold [place]" is more successful than gradually quitting.  Attending in-person counseling to help you build problem-solving skills. You are more likely to have success in quitting if you attend several counseling sessions. Even short sessions of 10 minutes can be effective.  Finding resources and support systems that can help you to quit  smoking and remain smoke-free after you quit. These resources are most helpful when you use them often. They can include:  Online chats with a Veterinary surgeon.  Telephone quitlines.  Printed Materials engineer.  Support groups or group counseling.  Text messaging programs.  Mobile phone applications.  Taking medicines to help you quit smoking. (If you are pregnant or breastfeeding, talk with your health care provider first.) Some medicines contain nicotine and some do not. Both types of medicines help with cravings, but the medicines that include nicotine help to relieve withdrawal symptoms. Your health care provider may recommend:  Nicotine patches, gum, or lozenges.  Nicotine inhalers or  sprays.  Non-nicotine medicine that is taken by mouth. Talk with your health care provider about combining strategies, such as taking medicines while you are also receiving in-person counseling. Using these two strategies together makes you more likely to succeed in quitting than if you used either strategy on its own. If you are pregnant or breastfeeding, talk with your health care provider about finding counseling or other support strategies to quit smoking. Do not take medicine to help you quit smoking unless told to do so by your health care provider. What things can I do to make it easier to quit? Quitting smoking might feel overwhelming at first, but there is a lot that you can do to make it easier. Take these important actions:  Reach out to your family and friends and ask that they support and encourage you during this time. Call telephone quitlines, reach out to support groups, or work with a counselor for support.  Ask people who smoke to avoid smoking around you.  Avoid places that trigger you to smoke, such as bars, parties, or smoke-break areas at work.  Spend time around people who do not smoke.  Lessen stress in your life, because stress can be a smoking trigger for some people. To lessen stress, try:  Exercising regularly.  Deep-breathing exercises.  Yoga.  Meditating.  Performing a body scan. This involves closing your eyes, scanning your body from head to toe, and noticing which parts of your body are particularly tense. Purposefully relax the muscles in those areas.  Download or purchase mobile phone or tablet apps (applications) that can help you stick to your quit plan by providing reminders, tips, and encouragement. There are many free apps, such as QuitGuide from the [employer] developer for Disease Control and Prevention). You can find other support for quitting smoking (smoking cessation) through smokefree.gov and other websites. How will I feel when I quit  smoking? Within the first 24 hours of quitting smoking, you may start to feel some withdrawal symptoms. These symptoms are usually most noticeable 2-3 days after quitting, but they usually do not last beyond 2-3 weeks. Changes or symptoms that you might experience include:  Mood swings.  Restlessness, anxiety, or irritation.  Difficulty concentrating.  Dizziness.  Strong cravings for sugary foods in addition to nicotine.  Mild weight gain.  Constipation.  Nausea.  Coughing or a sore throat.  Changes in how your medicines work in your body.  A depressed mood.  Difficulty sleeping (insomnia). After the first 2-3 weeks of quitting, you may start to notice more positive results, such as:  Improved sense of smell and taste.  Decreased coughing and sore throat.  Slower heart rate.  Lower blood pressure.  Clearer skin.  The ability to breathe more easily.  Fewer sick days. Quitting smoking is very challenging for most people. Do not get discouraged  if you are not successful the first time. Some people need to make many attempts to quit before they achieve long-term success. Do your best to stick to your quit plan, and talk with your health care provider if you have any questions or concerns. This information is not intended to replace advice given to you by your health care provider. Make sure you discuss any questions you have with your health care provider. Document Released: 11/20/2001 Document Revised: 07/24/2016 Document Reviewed: 04/12/2015 Elsevier Interactive Patient Education  2017 [employer].

## 2016-12-25 ENCOUNTER — Encounter: Payer: Self-pay | Admitting: Gynecology

## 2017-01-03 ENCOUNTER — Telehealth: Payer: Self-pay

## 2017-01-03 NOTE — Telephone Encounter (Signed)
I called patient to let her know I received fax from [facility] regarding her Lupron. The [facility] pharmacy just needs her to call to get her verbal consent to ship the Lupron. I provided her with the phone number.

## 2017-01-07 DIAGNOSIS — N912 Amenorrhea, unspecified: Secondary | ICD-10-CM | POA: Diagnosis not present

## 2017-01-10 ENCOUNTER — Telehealth: Payer: Self-pay

## 2017-01-10 DIAGNOSIS — Z331 Pregnant state, incidental: Secondary | ICD-10-CM | POA: Diagnosis not present

## 2017-01-10 NOTE — Telephone Encounter (Signed)
Patient called back and left message to inform me that she conceived last month and is pregnant. She is seeing a different MD-OB.  She obviously does not want the Lupron.  I will call and cancel the Rx order.

## 2017-01-10 NOTE — Telephone Encounter (Signed)
I called patient and left message in her voice mail that I received another message from [facility] that her Lupron is ready and waiting for her verbal consent to ship. I provided her with phone number again. I asked her to call me and let me know if for some reason she has changed her mind about this medication.

## 2017-01-17 DIAGNOSIS — Z331 Pregnant state, incidental: Secondary | ICD-10-CM | POA: Diagnosis not present

## 2017-01-17 DIAGNOSIS — Z3490 Encounter for supervision of normal pregnancy, unspecified, unspecified trimester: Secondary | ICD-10-CM | POA: Diagnosis not present

## 2017-01-28 DIAGNOSIS — O09291 Supervision of pregnancy with other poor reproductive or obstetric history, first trimester: Secondary | ICD-10-CM | POA: Diagnosis not present

## 2017-01-28 DIAGNOSIS — Z3480 Encounter for supervision of other normal pregnancy, unspecified trimester: Secondary | ICD-10-CM | POA: Diagnosis not present

## 2017-01-28 DIAGNOSIS — Z6826 Body mass index (BMI) 26.0-26.9, adult: Secondary | ICD-10-CM | POA: Diagnosis not present

## 2017-01-28 DIAGNOSIS — Z3481 Encounter for supervision of other normal pregnancy, first trimester: Secondary | ICD-10-CM | POA: Diagnosis not present

## 2017-02-26 DIAGNOSIS — Z3482 Encounter for supervision of other normal pregnancy, second trimester: Secondary | ICD-10-CM | POA: Diagnosis not present

## 2017-02-26 DIAGNOSIS — Z3483 Encounter for supervision of other normal pregnancy, third trimester: Secondary | ICD-10-CM | POA: Diagnosis not present

## 2017-02-26 DIAGNOSIS — Z124 Encounter for screening for malignant neoplasm of cervix: Secondary | ICD-10-CM | POA: Diagnosis not present

## 2017-03-12 DIAGNOSIS — Z36 Encounter for antenatal screening for chromosomal anomalies: Secondary | ICD-10-CM | POA: Diagnosis not present

## 2017-03-12 DIAGNOSIS — Z3682 Encounter for antenatal screening for nuchal translucency: Secondary | ICD-10-CM | POA: Diagnosis not present

## 2017-03-30 ENCOUNTER — Inpatient Hospital Stay (HOSPITAL_COMMUNITY)
Admission: AD | Admit: 2017-03-30 | Discharge: 2017-03-30 | Disposition: A | Discharge status: Home / Self Care | Payer: BLUE CROSS/BLUE SHIELD | Source: Ambulatory Visit | Attending: Obstetrics and Gynecology | Admitting: Obstetrics and Gynecology

## 2017-03-30 ENCOUNTER — Encounter (HOSPITAL_COMMUNITY): Payer: Self-pay

## 2017-03-30 DIAGNOSIS — O26892 Other specified pregnancy related conditions, second trimester: Secondary | ICD-10-CM

## 2017-03-30 DIAGNOSIS — R109 Unspecified abdominal pain: Secondary | ICD-10-CM

## 2017-03-30 DIAGNOSIS — R1010 Upper abdominal pain, unspecified: Secondary | ICD-10-CM | POA: Diagnosis not present

## 2017-03-30 DIAGNOSIS — O99332 Smoking (tobacco) complicating pregnancy, second trimester: Secondary | ICD-10-CM | POA: Insufficient documentation

## 2017-03-30 DIAGNOSIS — F17210 Nicotine dependence, cigarettes, uncomplicated: Secondary | ICD-10-CM | POA: Insufficient documentation

## 2017-03-30 DIAGNOSIS — O26899 Other specified pregnancy related conditions, unspecified trimester: Secondary | ICD-10-CM

## 2017-03-30 DIAGNOSIS — K5229 Other allergic and dietetic gastroenteritis and colitis: Secondary | ICD-10-CM

## 2017-03-30 DIAGNOSIS — Z3A15 15 weeks gestation of pregnancy: Secondary | ICD-10-CM | POA: Diagnosis not present

## 2017-03-30 DIAGNOSIS — Z79899 Other long term (current) drug therapy: Secondary | ICD-10-CM | POA: Diagnosis not present

## 2017-03-30 DIAGNOSIS — R12 Heartburn: Secondary | ICD-10-CM | POA: Diagnosis not present

## 2017-03-30 LAB — URINALYSIS, ROUTINE W REFLEX MICROSCOPIC
BILIRUBIN URINE: NEGATIVE
Glucose, UA: NEGATIVE mg/dL
HGB URINE DIPSTICK: NEGATIVE
KETONES UR: NEGATIVE mg/dL
LEUKOCYTES UA: NEGATIVE
NITRITE: NEGATIVE
PH: 8 (ref 5.0–8.0)
Protein, ur: 30 mg/dL — AB
Specific Gravity, Urine: 1.018 (ref 1.005–1.030)

## 2017-03-30 LAB — CBC
HCT: 35 % — ABNORMAL LOW (ref 36.0–46.0)
Hemoglobin: 12.5 g/dL (ref 12.0–15.0)
MCH: 31.7 pg (ref 26.0–34.0)
MCHC: 35.7 g/dL (ref 30.0–36.0)
MCV: 88.8 fL (ref 78.0–100.0)
Platelets: 277 10*3/uL (ref 150–400)
RBC: 3.94 MIL/uL (ref 3.87–5.11)
RDW: 12.5 % (ref 11.5–15.5)
WBC: 14.3 10*3/uL — ABNORMAL HIGH (ref 4.0–10.5)

## 2017-03-30 MED ORDER — RANITIDINE HCL 150 MG PO TABS: 150.0000 mg | ORAL_TABLET | Freq: Two times a day (BID) | ORAL | 0 refills | Status: DC

## 2017-03-30 MED ORDER — GI COCKTAIL ~~LOC~~
30.0000 mL | Freq: Once | ORAL | Status: AC
  Administered 2017-03-30: 30 mL via ORAL
  Filled 2017-03-30: qty 30

## 2017-03-30 NOTE — MAU Provider Note (Signed)
Chief Complaint: Abdominal Pain   First Provider Initiated Contact with Patient 03/30/17 2119      SUBJECTIVE HPI: Gina Adams is a 26 y.o. G2P0010 at [redacted]w[redacted]d by LMP who presents to maternity admissions reporting acute onset of mid upper abdominal pain about 45 minutes before arriving in MAU. Her pain radiates down to the right mid/lower abdomen, especially when she moves. Her pain increases with movement/walking.  Nothing makes it better. She reports last eating a hamburger from [employer] 4-5 hours before onset of pain.  She reports diarrhea x 4 daily for 3-4 days, no other symptoms of illness and no sick contacts.  She has daily nausea, which is improved from earlier in the pregnancy but denies vomiting in last 1-2 days. She denies vaginal bleeding, vaginal itching/burning, urinary symptoms, h/a, dizziness, or fever/chills.     HPI  Past Medical History:  Diagnosis Date  . CIN I (cervical intraepithelial neoplasia I) 2012  . Endometriosis   . Panic   . Pap smear of cervix with ASCUS, cannot exclude HGSIL 2012   Past Surgical History:  Procedure Laterality Date  . DIAGNOSTIC LAPAROSCOPY    . DILATION AND EVACUATION N/A 08/08/2015   Procedure: DILATATION AND EVACUATION;  Surgeon: Zelphia Cairo, MD;  Location: [facility];  Service: Gynecology;  Laterality: N/A;  . TONSILLECTOMY     Social History   Social History  . Marital status: Married    Spouse name: N/A  . Number of children: N/A  . Years of education: N/A   Occupational History  . Not on file.   Social History Main Topics  . Smoking status: Current Every Day Smoker    Packs/day: 0.15    Types: Cigarettes  . Smokeless tobacco: Never Used  . Alcohol use Yes     Comment: occ  . Drug use: No     Comment: denies 09/23/2015 marijuana when pt was 15  . Sexual activity: Yes    Birth control/ protection: None   Other Topics Concern  . Not on file   Social History Narrative  . No narrative on file   No current  facility-administered medications on file prior to encounter.    Current Outpatient Prescriptions on File Prior to Encounter  Medication Sig Dispense Refill  . doxycycline (VIBRAMYCIN) 100 MG capsule Take 1 capsule (100 mg total) by mouth 2 (two) times daily. Take one tablet twice a day for three days starting the day before the procedure. 6 capsule 0   Allergies  Allergen Reactions  . Seroquel [Quetiapine] Other (See Comments)    Felt like she couldn't get out of bed, felt very weak    ROS:  Review of Systems  Constitutional: Negative for chills, fatigue and fever.  Respiratory: Negative for shortness of breath.   Cardiovascular: Negative for chest pain.  Gastrointestinal: Positive for abdominal pain, diarrhea and nausea. Negative for vomiting.  Genitourinary: Positive for pelvic pain. Negative for difficulty urinating, dysuria, flank pain, vaginal bleeding, vaginal discharge and vaginal pain.  Neurological: Negative for dizziness and headaches.  Psychiatric/Behavioral: Negative.      I have reviewed patient's Past Medical Hx, Surgical Hx, Family Hx, Social Hx, medications and allergies.   Physical Exam   Patient Vitals for the past 24 hrs:  BP Temp Temp src Pulse Resp  03/30/17 2058 130/70 98.1 F (36.7 C) Oral 85 18   Constitutional: Well-developed, well-nourished female in no acute distress.  Cardiovascular: normal rate Respiratory: normal effort GI: Abd soft, non-tender. No  rebound tenderness or guarding. Pos BS x 4 MS: Extremities nontender, no edema, normal ROM Neurologic: Alert and oriented x 4.  GU: Neg CVAT.  PELVIC EXAM: Deferred  FHT 163 by doppler  LAB RESULTS Results for orders placed or performed during the hospital encounter of 03/30/17 (from the past 24 hour(s))  Urinalysis, Routine w reflex microscopic     Status: Abnormal   Collection Time: 03/30/17  8:45 PM  Result Value Ref Range   Color, Urine YELLOW YELLOW   APPearance TURBID (A) CLEAR    Specific Gravity, Urine 1.018 1.005 - 1.030   pH 8.0 5.0 - 8.0   Glucose, UA NEGATIVE NEGATIVE mg/dL   Hgb urine dipstick NEGATIVE NEGATIVE   Bilirubin Urine NEGATIVE NEGATIVE   Ketones, ur NEGATIVE NEGATIVE mg/dL   Protein, ur 30 (A) NEGATIVE mg/dL   Nitrite NEGATIVE NEGATIVE   Leukocytes, UA NEGATIVE NEGATIVE   RBC / HPF 0-5 0 - 5 RBC/hpf   WBC, UA TOO NUMEROUS TO COUNT 0 - 5 WBC/hpf   Bacteria, UA FEW (A) NONE SEEN   Squamous Epithelial / LPF 6-30 (A) NONE SEEN   Mucous PRESENT   CBC     Status: Abnormal   Collection Time: 03/30/17  9:22 PM  Result Value Ref Range   WBC 14.3 (H) 4.0 - 10.5 K/uL   RBC 3.94 3.87 - 5.11 MIL/uL   Hemoglobin 12.5 12.0 - 15.0 g/dL   HCT 40.9 (L) 81.1 - 91.4 %   MCV 88.8 78.0 - 100.0 fL   MCH 31.7 26.0 - 34.0 pg   MCHC 35.7 30.0 - 36.0 g/dL   RDW 78.2 95.6 - 21.3 %   Platelets 277 150 - 400 K/uL       IMAGING No results found.  MAU Management/MDM: Ordered labs and reviewed results.  No evidence of acute abdomen, FHT wnl.  Pain reduced significantly from 7/10 to 3/10 with GI Cocktail.  Pain is likely digestive in origin.  Pt can take Zofran she has at home for nausea and diarrhea PRN, add Zantac 150 mg BID PRN for acid reflux.  F/U with OB/Gyn provider as scheduled, return to MAU as needed for emergencies.  Pt stable at time of discharge.  ASSESSMENT 1. Abdominal pain affecting pregnancy   2. Dietetic gastroenteritis, unspecified dietetic type   3. Heartburn during pregnancy in second trimester     PLAN Discharge home Allergies as of 03/30/2017      Reactions   Seroquel [quetiapine] Other (See Comments)   Felt like she couldn't get out of bed, felt very weak      Medication List    STOP taking these medications   doxycycline 100 MG capsule Commonly known as:  VIBRAMYCIN     TAKE these medications   ranitidine 150 MG tablet Commonly known as:  ZANTAC Take 1 tablet (150 mg total) by mouth 2 (two) times daily.      Follow-up  Information    [facility] Follow up.   Why:  As scheduled, return to MAU as needed for emergencies          Sharen Counter Certified Nurse-Midwife 03/30/2017  10:52 PM

## 2017-03-30 NOTE — Progress Notes (Addendum)
G2P0 @ 15.[redacted] wksga. Presents to triage for lower abdominal pain that started 45 mins ago. Denies LOF or bleedng. Doppler 158-163. VSS. See flow sheet for details.   2120: provider at bs assessing pt  2125: Water given and pt tolerated it well.   2205: GI cocktail given. Pt states pain decreased since been here resting.   2250: pt states GI cocktail helped with pain. 2/10 pain  2300: discharge instructions given with pt understanding. Pt left unit with SO.

## 2017-04-09 DIAGNOSIS — Z363 Encounter for antenatal screening for malformations: Secondary | ICD-10-CM | POA: Diagnosis not present

## 2017-04-24 ENCOUNTER — Encounter: Payer: Self-pay | Admitting: Gynecology

## 2017-04-25 DIAGNOSIS — Z363 Encounter for antenatal screening for malformations: Secondary | ICD-10-CM | POA: Diagnosis not present

## 2017-06-18 DIAGNOSIS — Z23 Encounter for immunization: Secondary | ICD-10-CM | POA: Diagnosis not present

## 2017-06-18 DIAGNOSIS — Z3402 Encounter for supervision of normal first pregnancy, second trimester: Secondary | ICD-10-CM | POA: Diagnosis not present

## 2017-06-27 DIAGNOSIS — Z331 Pregnant state, incidental: Secondary | ICD-10-CM | POA: Diagnosis not present

## 2017-06-27 DIAGNOSIS — R1030 Lower abdominal pain, unspecified: Secondary | ICD-10-CM | POA: Diagnosis not present

## 2017-06-28 DIAGNOSIS — Z041 Encounter for examination and observation following transport accident: Secondary | ICD-10-CM | POA: Diagnosis not present

## 2017-06-28 DIAGNOSIS — Z3A27 27 weeks gestation of pregnancy: Secondary | ICD-10-CM | POA: Diagnosis not present

## 2017-06-28 DIAGNOSIS — O360120 Maternal care for anti-D [Rh] antibodies, second trimester, not applicable or unspecified: Secondary | ICD-10-CM | POA: Diagnosis not present

## 2017-07-25 DIAGNOSIS — R1011 Right upper quadrant pain: Secondary | ICD-10-CM | POA: Diagnosis not present

## 2017-08-06 DIAGNOSIS — O99013 Anemia complicating pregnancy, third trimester: Secondary | ICD-10-CM | POA: Diagnosis not present

## 2017-08-16 DIAGNOSIS — L02415 Cutaneous abscess of right lower limb: Secondary | ICD-10-CM | POA: Diagnosis not present

## 2017-08-19 DIAGNOSIS — Z369 Encounter for antenatal screening, unspecified: Secondary | ICD-10-CM | POA: Diagnosis not present

## 2017-08-19 DIAGNOSIS — Z3685 Encounter for antenatal screening for Streptococcus B: Secondary | ICD-10-CM | POA: Diagnosis not present

## 2017-09-10 DIAGNOSIS — O26859 Spotting complicating pregnancy, unspecified trimester: Secondary | ICD-10-CM | POA: Diagnosis not present

## 2017-09-10 DIAGNOSIS — O368190 Decreased fetal movements, unspecified trimester, not applicable or unspecified: Secondary | ICD-10-CM | POA: Diagnosis not present

## 2017-09-10 DIAGNOSIS — Z3A00 Weeks of gestation of pregnancy not specified: Secondary | ICD-10-CM | POA: Diagnosis not present

## 2017-09-10 DIAGNOSIS — O368131 Decreased fetal movements, third trimester, fetus 1: Secondary | ICD-10-CM | POA: Diagnosis not present

## 2017-09-10 DIAGNOSIS — O26899 Other specified pregnancy related conditions, unspecified trimester: Secondary | ICD-10-CM | POA: Diagnosis not present

## 2017-09-20 DIAGNOSIS — Z3A39 39 weeks gestation of pregnancy: Secondary | ICD-10-CM | POA: Diagnosis not present

## 2017-09-20 DIAGNOSIS — O1493 Unspecified pre-eclampsia, third trimester: Secondary | ICD-10-CM | POA: Diagnosis not present

## 2017-09-20 DIAGNOSIS — O99334 Smoking (tobacco) complicating childbirth: Secondary | ICD-10-CM | POA: Diagnosis not present

## 2017-09-20 DIAGNOSIS — Z3A40 40 weeks gestation of pregnancy: Secondary | ICD-10-CM | POA: Diagnosis not present

## 2017-09-20 DIAGNOSIS — F419 Anxiety disorder, unspecified: Secondary | ICD-10-CM | POA: Diagnosis not present

## 2017-09-20 DIAGNOSIS — O99344 Other mental disorders complicating childbirth: Secondary | ICD-10-CM | POA: Diagnosis not present

## 2017-09-20 DIAGNOSIS — F17210 Nicotine dependence, cigarettes, uncomplicated: Secondary | ICD-10-CM | POA: Diagnosis not present

## 2017-09-20 DIAGNOSIS — O0993 Supervision of high risk pregnancy, unspecified, third trimester: Secondary | ICD-10-CM | POA: Diagnosis not present

## 2017-09-20 DIAGNOSIS — O1404 Mild to moderate pre-eclampsia, complicating childbirth: Secondary | ICD-10-CM | POA: Diagnosis not present

## 2017-09-20 DIAGNOSIS — O1403 Mild to moderate pre-eclampsia, third trimester: Secondary | ICD-10-CM | POA: Diagnosis not present

## 2017-09-21 DIAGNOSIS — O1493 Unspecified pre-eclampsia, third trimester: Secondary | ICD-10-CM | POA: Diagnosis not present

## 2017-09-21 DIAGNOSIS — Z3A40 40 weeks gestation of pregnancy: Secondary | ICD-10-CM | POA: Diagnosis not present

## 2017-12-08 ENCOUNTER — Encounter (HOSPITAL_COMMUNITY): Payer: Self-pay

## 2017-12-30 DIAGNOSIS — Z6832 Body mass index (BMI) 32.0-32.9, adult: Secondary | ICD-10-CM | POA: Diagnosis not present

## 2018-03-10 ENCOUNTER — Telehealth: Payer: Self-pay | Admitting: Internal Medicine

## 2018-03-10 NOTE — Telephone Encounter (Signed)
Copied from CRM [id number]. Topic: Appointment Scheduling - Scheduling Inquiry for Clinic >> Mar 10, 2018  8:06 AM Maia Petties wrote: Reason for CRM: Dr. Oliver Barre is seeing pts husband, Gina Adams 09/28/1986. She is requesting to be established as a new pt with him as well. Please advise.

## 2018-03-10 NOTE — Telephone Encounter (Signed)
Dr. [name],  Would you be willing to see this patient to establish care?

## 2018-03-10 NOTE — Telephone Encounter (Signed)
Ok with me 

## 2018-03-10 NOTE — Telephone Encounter (Signed)
Left message for patient to call back to schedule. Please schedule as an Office Visit with note stating New Patient/ Okay per Dr [name].

## 2018-03-12 DIAGNOSIS — F319 Bipolar disorder, unspecified: Secondary | ICD-10-CM | POA: Diagnosis not present

## 2018-03-12 DIAGNOSIS — F17210 Nicotine dependence, cigarettes, uncomplicated: Secondary | ICD-10-CM | POA: Diagnosis not present

## 2018-03-12 DIAGNOSIS — Z79899 Other long term (current) drug therapy: Secondary | ICD-10-CM | POA: Diagnosis not present

## 2018-03-12 DIAGNOSIS — R202 Paresthesia of skin: Secondary | ICD-10-CM | POA: Diagnosis not present

## 2018-03-12 DIAGNOSIS — F419 Anxiety disorder, unspecified: Secondary | ICD-10-CM | POA: Diagnosis not present

## 2018-03-12 DIAGNOSIS — R200 Anesthesia of skin: Secondary | ICD-10-CM | POA: Diagnosis not present

## 2018-03-14 ENCOUNTER — Ambulatory Visit: Payer: BLUE CROSS/BLUE SHIELD | Admitting: Internal Medicine

## 2018-03-14 ENCOUNTER — Other Ambulatory Visit (INDEPENDENT_AMBULATORY_CARE_PROVIDER_SITE_OTHER): Payer: BLUE CROSS/BLUE SHIELD

## 2018-03-14 ENCOUNTER — Encounter: Payer: Self-pay | Admitting: Internal Medicine

## 2018-03-14 VITALS — BP 138/90 | HR 102 | Temp 98.2°F | Ht 63.0 in | Wt 187.0 lb

## 2018-03-14 DIAGNOSIS — R202 Paresthesia of skin: Secondary | ICD-10-CM | POA: Diagnosis not present

## 2018-03-14 DIAGNOSIS — F418 Other specified anxiety disorders: Secondary | ICD-10-CM

## 2018-03-14 DIAGNOSIS — Z0000 Encounter for general adult medical examination without abnormal findings: Secondary | ICD-10-CM | POA: Diagnosis not present

## 2018-03-14 DIAGNOSIS — R531 Weakness: Secondary | ICD-10-CM | POA: Diagnosis not present

## 2018-03-14 DIAGNOSIS — R29818 Other symptoms and signs involving the nervous system: Secondary | ICD-10-CM

## 2018-03-14 LAB — VITAMIN B12: VITAMIN B 12: 359 pg/mL (ref 211–911)

## 2018-03-14 NOTE — Patient Instructions (Signed)
Please continue all other medications as before, and refills have been done if requested.  Please have the pharmacy call with any other refills you may need.  Please continue your efforts at being more active, low cholesterol diet, and weight control.  You are otherwise up to date with prevention measures today.  Please keep your appointments with your specialists as you may have planned  You will be contacted regarding the referral for: MRI, Neurology  Please go to the LAB in the Basement (turn left off the elevator) for the tests to be done today  You will be contacted by phone if any changes need to be made immediately.  Otherwise, you will receive a letter about your results with an explanation, but please check with MyChart first.  Please remember to sign up for MyChart if you have not done so, as this will be important to you in the future with finding out test results, communicating by private email, and scheduling acute appointments online when needed.  Please return in 1 year for your yearly visit, or sooner if needed

## 2018-03-14 NOTE — Progress Notes (Addendum)
Subjective:    Patient ID: Gina Adams, female    DOB: 01/28/1991, 27 y.o.   MRN: [id number]  HPI   Here for wellness and f/u;  Overall doing ok;  Pt denies Chest pain, worsening SOB, DOE, wheezing, orthopnea, PND, worsening LE edema, palpitations, dizziness or syncope.  Pt denies neurological change such as new headache, facial or extremity weakness.  Pt denies polydipsia, polyuria, or low sugar symptoms. Pt states overall good compliance with treatment and medications, good tolerability, and has been trying to follow appropriate diet.  Pt denies worsening depressive symptoms, suicidal ideation or panic. No fever, night sweats, wt loss, loss of appetite, or other constitutional symptoms.  Pt states good ability with ADL's, has low fall risk, home safety reviewed and adequate, no other significant changes in hearing or vision, and occasionally active with exercise.  Declines tetanus.  G2PO1A0 with new child 44moold .  Hard to lose wt after baby.  Also, c/o onset last thur mar 28 with mid abd "shuddering and tingling" mild intermittent, next day awoke with numbness to left pinky and part of right hand, and now today virtually everywhere except face and arms.  [facility] review of care everywhere show pt has normal cbc, cmp, esr and crp. States she feels "heavy all over", hard now to even get OOB , makes her "very nervous" Past Medical History:  Diagnosis Date  . CIN I (cervical intraepithelial neoplasia I) 2012  . Endometriosis   . Panic   . Pap smear of cervix with ASCUS, cannot exclude HGSIL 2012   Past Surgical History:  Procedure Laterality Date  . DIAGNOSTIC LAPAROSCOPY    . DILATION AND EVACUATION N/A 08/08/2015   Procedure: DILATATION AND EVACUATION;  Surgeon: GMarylynn Pearson MD;  [facility];  Service: Gynecology;  Laterality: N/A;  . TONSILLECTOMY      reports that she has been smoking cigarettes.  She has  been smoking about 0.15 packs per day. She has never used smokeless tobacco. She reports that she drinks alcohol. She reports that she does not use drugs. family history includes Anxiety disorder in her brother; Ovarian cancer in her mother; Prostate cancer in her maternal grandfather; Skin cancer in her mother. Allergies  Allergen Reactions  . Seroquel [Quetiapine] Other (See Comments)    Felt like she couldn't get out of bed, felt very weak   Current Outpatient Medications on File Prior to Visit  Medication Sig Dispense Refill  . ranitidine (ZANTAC) 150 MG tablet Take 1 tablet (150 mg total) by mouth 2 (two) times daily. 60 tablet 0   No current facility-administered medications on file prior to visit.    Review of Systems  Constitutional: Negative for other unusual diaphoresis, sweats, appetite or weight changes HENT: Negative for other worsening hearing loss, ear pain, facial swelling, mouth sores or neck stiffness.   Eyes: Negative for other worsening pain, redness or other visual disturbance.  Respiratory: Negative for other stridor or swelling Cardiovascular: Negative for other palpitations or other chest pain  Gastrointestinal: Negative for worsening diarrhea or loose stools, blood in stool, distention or other pain Genitourinary: Negative for hematuria, flank pain or other change in urine volume.  Musculoskeletal: Negative for myalgias or other joint swelling.  Skin: Negative for other color change, or other wound or worsening drainage.  Neurological: Negative for other syncope or numbness. Hematological: Negative for other adenopathy or swelling Psychiatric/Behavioral:  Negative for hallucinations, other worsening agitation, SI, self-injury, or new decreased concentration All other system neg per pt    Objective:   Physical Exam BP 138/90   Pulse (!) 102   Temp 98.2 F (36.8 C) (Oral)   Ht 5' 3"  (1.6 m)   Wt 187 lb (84.8 kg)   SpO2 98%   BMI 33.13 kg/m  VS noted,  obese Constitutional: Pt is oriented to person, place, and time. Appears well-developed and well-nourished, in no significant distress and comfortable Head: Normocephalic and atraumatic  Eyes: Conjunctivae and EOM are normal. Pupils are equal, round, and reactive to light Right Ear: External ear normal without discharge Left Ear: External ear normal without discharge Nose: Nose without discharge or deformity Mouth/Throat: Oropharynx is without other ulcerations and moist  Neck: Normal range of motion. Neck supple. No JVD present. No tracheal deviation present or significant neck LA or mass Cardiovascular: Normal rate, regular rhythm, normal heart sounds and intact distal pulses.   Pulmonary/Chest: WOB normal and breath sounds without rales or wheezing  Abdominal: Soft. Bowel sounds are normal. NT. No HSM  Musculoskeletal: Normal range of motion. Exhibits no edema Lymphadenopathy: Has no other cervical adenopathy.  Neurological: Pt is alert and oriented to person, place, and time. Pt has normal reflexes. No cranial nerve deficit. Motor grossly intact, Gait intact, sens intact to LT Skin: Skin is warm and dry. No rash noted or new ulcerations Psychiatric:  Has nervous mood and affect. Behavior is normal without agitation No other exam findings  Care everywhere reivewed - 4/3 CBC, CMET, CRP and ESR essentially normal     Assessment & Plan:

## 2018-03-15 ENCOUNTER — Encounter: Payer: Self-pay | Admitting: Internal Medicine

## 2018-03-15 DIAGNOSIS — Z0000 Encounter for general adult medical examination without abnormal findings: Secondary | ICD-10-CM | POA: Insufficient documentation

## 2018-03-15 DIAGNOSIS — Z0001 Encounter for general adult medical examination with abnormal findings: Secondary | ICD-10-CM | POA: Insufficient documentation

## 2018-03-15 NOTE — Assessment & Plan Note (Signed)
Also for labs as ordered,  to f/u any worsening symptoms or concerns 

## 2018-03-15 NOTE — Assessment & Plan Note (Signed)

## 2018-03-15 NOTE — Assessment & Plan Note (Addendum)
Etiology unclear, for b12, MRI and Neurology referral  In addition to the time spent performing CPE, I spent an additional 25 minutes face to face,in which greater than 50% of this time was spent in counseling and coordination of care for patient's acute illness as documented, including the differential dx, treatment, further evaluation and other management of paresthesias, general weakness, anxiety/depresssion

## 2018-03-15 NOTE — Assessment & Plan Note (Signed)
I think at least moderate persistent post baby, pt declines tx for now

## 2018-03-17 ENCOUNTER — Encounter: Payer: Self-pay | Admitting: Neurology

## 2018-03-19 ENCOUNTER — Ambulatory Visit: Payer: BLUE CROSS/BLUE SHIELD | Admitting: Neurology

## 2018-03-19 ENCOUNTER — Encounter: Payer: Self-pay | Admitting: Neurology

## 2018-03-19 ENCOUNTER — Telehealth: Payer: Self-pay | Admitting: Neurology

## 2018-03-19 VITALS — BP 138/91 | HR 102 | Ht 62.0 in | Wt 185.5 lb

## 2018-03-19 DIAGNOSIS — R202 Paresthesia of skin: Secondary | ICD-10-CM | POA: Diagnosis not present

## 2018-03-19 NOTE — Progress Notes (Signed)
Reason for visit: Paresthesias  Referring physician: Dr. Gwenyth Bouillon is a 27 y.o. female  History of present illness:  Gina Adams is a 27 year old right-handed white female who is coming in for an evaluation of a change in sensory symptoms involving the arms and legs that began around 06 March 2018.  The patient claims that a week or more before onset of symptoms she began having significant problems with fatigue.  The patient initially began having a sensation of vibration in the stomach that would spread out and then dissipate for several days, then the patient began having some numbness that would go from the chest level down on the body and into the legs that has been more persistent.  At times, she feels as if the legs are heavy, but there is no true weakness.  She has not noted any balance problems or difficulty controlling the bowels or the bladder.  She has also noted some numbness in the hands per se and more recently some burning sensations in the upper arm on the left side.  She denies any sensory alterations on the face.  She indicates that she cannot bring on a shock sensations or increased tingling by flexing or extending the neck.  She has not had any headaches or dizziness, she denies any vision changes with exception that about 3 weeks ago she would sometimes note a trailer behind moving objects in the vision.  This has not persisted.  She does have some slight sensation of being foggy with her thinking.  The fatigue issue has recently improved significantly.  The patient has undergone blood work that includes a vitamin B12 level that is unremarkable.  A C-reactive protein was relatively normal, she has had a CBC and a chemistry profile and a sedimentation rate that was normal.  She has been set up for an MRI of the brain, this has not yet been done.  She is sent to this office for an evaluation.  Past Medical History:  Diagnosis Date  . CIN I (cervical intraepithelial  neoplasia I) 2012  . Endometriosis   . Panic   . Pap smear of cervix with ASCUS, cannot exclude HGSIL 2012    Past Surgical History:  Procedure Laterality Date  . DIAGNOSTIC LAPAROSCOPY    . DILATION AND EVACUATION N/A 08/08/2015   Procedure: DILATATION AND EVACUATION;  Surgeon: Zelphia Cairo, MD;  Location[facility];  Service: Gynecology;  Laterality: N/A;  . TONSILLECTOMY    . WISDOM TOOTH EXTRACTION     x4    Family History  Problem Relation Age of Onset  . Ovarian cancer Mother   . Skin cancer Mother   . Anxiety disorder Brother   . Prostate cancer Maternal Grandfather     Social history:  reports that she has been smoking cigarettes.  She has been smoking about 0.50 packs per day. She has never used smokeless tobacco. She reports that she drinks alcohol. She reports that she does not use drugs.  Medications:  Prior to Admission medications   Not on File      Allergies  Allergen Reactions  . Seroquel [Quetiapine] Other (See Comments)    Felt like she couldn't get out of bed, felt very weak    ROS:  Out of a complete 14 system review of symptoms, the patient complains only of the following symptoms, and all other reviewed systems are negative.  Weight gain, fatigue Feeling cold Numbness, weakness, tremor Sleepiness  Blood  pressure (!) 138/91, pulse (!) 102, height 5\' 2"  (1.575 m), weight 185 lb 8 oz (84.1 kg), unknown if currently breastfeeding.  Physical Exam  General: The patient is alert and cooperative at the time of the examination.  The patient is moderately obese.  Eyes: Pupils are equal, round, and reactive to light. Discs are flat bilaterally.  Neck: The neck is supple, no carotid bruits are noted.  Respiratory: The respiratory examination is clear.  Cardiovascular: The cardiovascular examination reveals a regular rate and rhythm, no obvious murmurs or rubs are noted.  Skin: Extremities are without significant edema.  Neurologic Exam  Mental  status: The patient is alert and oriented x 3 at the time of the examination. The patient has apparent normal recent and remote memory, with an apparently normal attention span and concentration ability.  Cranial nerves: Facial symmetry is present. There is good sensation of the face to pinprick and soft touch bilaterally. The strength of the facial muscles and the muscles to head turning and shoulder shrug are normal bilaterally. Speech is well enunciated, no aphasia or dysarthria is noted. Extraocular movements are full. Visual fields are full. The tongue is midline, and the patient has symmetric elevation of the soft palate. No obvious hearing deficits are noted.  Motor: The motor testing reveals 5 over 5 strength of all 4 extremities. Good symmetric motor tone is noted throughout.  Sensory: Sensory testing is intact to pinprick, soft touch, vibration sensation, and position sense on all 4 extremities. No evidence of extinction is noted.  Coordination: Cerebellar testing reveals good finger-nose-finger and heel-to-shin bilaterally.  Gait and station: Gait is normal. Tandem gait is normal. Romberg is negative. No drift is seen.  Reflexes: Deep tendon reflexes are symmetric and normal bilaterally. Toes are downgoing bilaterally.   Assessment/Plan:  1.  Subjective sensory alteration  The patient has sensory symptoms without any weakness.  The patient delivered a child about 5 months ago.  She reports squeezing sensations on her midsection, she has had sensory symptoms on all 4 extremities that were preceded by a period of significant fatigue.  The above scenario is suggestive of a possible multiple sclerosis exacerbation.  The patient has been sent for MRI of the brain, we will set her up for MRI of the cervical spine with and without gadolinium enhancement.  Further blood work in the future may be done.  She will follow-up in 4 months.  Marlan Palau MD 03/19/2018 3:43 PM  [facility] [address] [place], [place] [address]  Phone [phone] Fax [phone]

## 2018-03-19 NOTE — Patient Instructions (Signed)
   We will get MRI of the cervical spine. 

## 2018-03-19 NOTE — Telephone Encounter (Signed)
[employer] Auth: [id number] (exp. 03/19/18 to 04/17/18) order sent to [facility]. They will contact the pt to schedule.

## 2018-03-20 ENCOUNTER — Telehealth: Payer: Self-pay | Admitting: Neurology

## 2018-03-20 NOTE — Telephone Encounter (Signed)
error 

## 2018-03-20 NOTE — Telephone Encounter (Signed)
Byanna with [facility] reached out to the patient to schedule the MRI Cervical spine.Earney Mallet just called me and informed me when she spoke to the patient she stated that when she spoke to Dr. Anne Hahn he thought she should have a MRI Brain w/wo contrast. Do you want her to have a MRI brain w/wo contrast because Dr. [name] office already order a MRI brain wo contrast that she is scheduled for Saturday 03/22/18. Bryanna did call Dr. Raphael Gibney office to see if he would order a MRI brain w/wo contrast but hasn't got back to her on it yet.

## 2018-03-20 NOTE — Telephone Encounter (Signed)
I would prefer to have the MRI with and without contrast.

## 2018-03-21 ENCOUNTER — Other Ambulatory Visit: Payer: Self-pay | Admitting: Internal Medicine

## 2018-03-21 ENCOUNTER — Telehealth: Payer: Self-pay | Admitting: Neurology

## 2018-03-21 DIAGNOSIS — M6281 Muscle weakness (generalized): Secondary | ICD-10-CM

## 2018-03-21 NOTE — Telephone Encounter (Signed)
Noted, I left a message with Earney Mallet with [facility] to give me a call back to discuss this.

## 2018-03-21 NOTE — Telephone Encounter (Signed)
Irving Burton, you may need to call and discuss this with the pt, so she understands that our hands are tied regarding this MR brain.

## 2018-03-21 NOTE — Telephone Encounter (Signed)
I called pt. She has noticed increased problems with hand movements/coordination for the past week and a half, but it seems to have gotten worse the past 2 days.   Pt is scheduled for her MRI brain tomorrow at [facility] as ordered by Dr. [name]. Pt says that Dr. Anne Hahn asked her to have the MRI brain performed with contrast, but [facility] is refusing to add contrast until the order is changed. She is wondering if we can get this ordered with contrast so she can have the MRI tomorrow morning as scheduled. Pt knows that I will send this requests to Dr. Patrecia Pace and our MRI coordinator. Pt verbalized understanding.

## 2018-03-21 NOTE — Telephone Encounter (Signed)
I called the patient.  The patient is having some increased numbness in the hands and some clumsiness, MRI of the brain will be done tomorrow.  If this is abnormal, then I will initiate treatment with prednisone.

## 2018-03-21 NOTE — Telephone Encounter (Signed)
Pt states she's having trouble buttoning things and her hands are working slow increasing at night. She is wanting to know if there is something that can be prescribed to help with this. Please call to advise

## 2018-03-22 ENCOUNTER — Telehealth: Payer: Self-pay | Admitting: Neurology

## 2018-03-22 ENCOUNTER — Ambulatory Visit
Admission: RE | Admit: 2018-03-22 | Discharge: 2018-03-22 | Disposition: A | Discharge status: Home / Self Care | Payer: BLUE CROSS/BLUE SHIELD | Source: Ambulatory Visit | Attending: Internal Medicine | Admitting: Internal Medicine

## 2018-03-22 DIAGNOSIS — R251 Tremor, unspecified: Secondary | ICD-10-CM | POA: Diagnosis not present

## 2018-03-22 DIAGNOSIS — R29818 Other symptoms and signs involving the nervous system: Secondary | ICD-10-CM

## 2018-03-22 NOTE — Telephone Encounter (Signed)
I tried to call patient.  Left message, I will call back later to go over the MRI results.   MRI brain 03/22/18:  IMPRESSION: 1. Widespread abnormal cerebral white matter signal, and evidence of associated cervical spinal cord abnormality (#2), strongly suggestive of Multiple Sclerosis in this clinical setting. 2. Demyelinating lesion suspected in the cervical spinal cord at C3. Dedicated Cervical and Thoracic Spine MRI may be valuable.

## 2018-03-23 MED ORDER — ALPRAZOLAM 0.5 MG PO TABS: ORAL_TABLET | ORAL | 0 refills | Status: DC

## 2018-03-23 MED ORDER — PREDNISONE 10 MG PO TABS: ORAL_TABLET | ORAL | 0 refills | Status: DC

## 2018-03-23 NOTE — Telephone Encounter (Signed)
I called the patient.  I discussed the results of the MRI of the brain with her, we will get a revisit in the next week or so to discuss an appropriate treatment for her multiple sclerosis.  Further blood work will be done at that time.  MRI of the brain will be done.  I have called in a prescription for prednisone and alprazolam for the MRI.

## 2018-03-24 ENCOUNTER — Encounter: Payer: Self-pay | Admitting: Internal Medicine

## 2018-03-24 NOTE — Telephone Encounter (Signed)
Called pt. Scheduled work in visit for 03/26/18 at 230pm w/ CW,MD. She verbalized understanding and appreciation for call.

## 2018-03-26 ENCOUNTER — Ambulatory Visit: Payer: BLUE CROSS/BLUE SHIELD | Admitting: Neurology

## 2018-03-26 ENCOUNTER — Encounter: Payer: Self-pay | Admitting: Neurology

## 2018-03-26 ENCOUNTER — Ambulatory Visit
Admission: RE | Admit: 2018-03-26 | Discharge: 2018-03-26 | Disposition: A | Discharge status: Home / Self Care | Payer: BLUE CROSS/BLUE SHIELD | Source: Ambulatory Visit | Attending: Neurology | Admitting: Neurology

## 2018-03-26 VITALS — BP 137/86 | HR 92 | Ht 62.0 in | Wt 184.0 lb

## 2018-03-26 DIAGNOSIS — G35 Multiple sclerosis: Secondary | ICD-10-CM | POA: Diagnosis not present

## 2018-03-26 DIAGNOSIS — Z5181 Encounter for therapeutic drug level monitoring: Secondary | ICD-10-CM

## 2018-03-26 DIAGNOSIS — R202 Paresthesia of skin: Secondary | ICD-10-CM | POA: Diagnosis not present

## 2018-03-26 HISTORY — DX: Multiple sclerosis: G35

## 2018-03-26 MED ORDER — GADOBENATE DIMEGLUMINE 529 MG/ML IV SOLN
18.0000 mL | Freq: Once | INTRAVENOUS | Status: AC | PRN
  Administered 2018-03-26: 18 mL via INTRAVENOUS

## 2018-03-26 NOTE — Progress Notes (Addendum)
Reason for visit: Multiple sclerosis  Gina Adams is an 27 y.o. female  History of present illness:  Gina Adams is a 27 year old right-handed white female with a history of paresthesias on all 4 extremities.  Recent MRI of the brain has shown evidence of multiple white matter lesions in the supratentorial area but also a C3 level posterior spinal cord lesion that likely is the source of her current symptoms.  The patient has had some progressive symptoms with numbness going up both legs, now affecting both hands.  The patient was placed on a prednisone Dosepak 5 days ago, she has already gained some benefit with this sensation changes in the feet.  She has a Lhermitte's sign, when she flexes her neck down she will have increased tingling in both hands and she gets a vibration sensation down the spine.  The patient has not noted any balance changes or difficulty controlling the bowels or the bladder.  Over the last month she has had some increased fatigue and some difficulty with cognitive slowing.  The patient returns to the office today for an evaluation and to determine the next course of treatment.  Past Medical History:  Diagnosis Date  . CIN I (cervical intraepithelial neoplasia I) 2012  . Endometriosis   . Multiple sclerosis (HCC) 03/26/2018  . Panic   . Pap smear of cervix with ASCUS, cannot exclude HGSIL 2012    Past Surgical History:  Procedure Laterality Date  . DIAGNOSTIC LAPAROSCOPY    . DILATION AND EVACUATION N/A 08/08/2015   Procedure: DILATATION AND EVACUATION;  Surgeon: Zelphia Cairo, MD;  Location: [facility];  Service: Gynecology;  Laterality: N/A;  . TONSILLECTOMY    . WISDOM TOOTH EXTRACTION     x4    Family History  Problem Relation Age of Onset  . Ovarian cancer Mother   . Skin cancer Mother   . Anxiety disorder Brother   . Prostate cancer Maternal Grandfather     Social history:  reports that she has been smoking cigarettes.  She has been smoking about  0.50 packs per day. She has never used smokeless tobacco. She reports that she drinks alcohol. She reports that she does not use drugs.    Allergies  Allergen Reactions  . Seroquel [Quetiapine] Other (See Comments)    Felt like she couldn't get out of bed, felt very weak    Medications:  Prior to Admission medications   Medication Sig Start Date End Date Taking? Authorizing Provider  ALPRAZolam Prudy Feeler) 0.5 MG tablet Take 2 tablets approximately 45 minutes prior to the MRI study, take a third tablet if needed. 03/23/18   York Spaniel, MD  predniSONE (DELTASONE) 10 MG tablet Begin taking 6 tablets daily, taper by one tablet every other day until off the medication. 03/23/18   York Spaniel, MD    ROS:  Out of a complete 14 system review of symptoms, the patient complains only of the following symptoms, and all other reviewed systems are negative.  Memory loss, numbness, tremors  Blood pressure 137/86, pulse 92, height 5\' 2"  (1.575 m), weight 184 lb (83.5 kg), unknown if currently breastfeeding.  Physical Exam  General: The patient is alert and cooperative at the time of the examination.  The patient is moderately obese.  Skin: No significant peripheral edema is noted.   Neurologic Exam  Mental status: The patient is alert and oriented x 3 at the time of the examination. The patient has apparent normal recent  and remote memory, with an apparently normal attention span and concentration ability.   Cranial nerves: Facial symmetry is present. Speech is normal, no aphasia or dysarthria is noted. Extraocular movements are full. Visual fields are full.  Motor: The patient has good strength in all 4 extremities.  Sensory examination: Soft touch sensation is symmetric on the face, arms, and legs.  Coordination: The patient has good finger-nose-finger and heel-to-shin bilaterally.  Gait and station: The patient has a normal gait. Tandem gait is normal. Romberg is negative. No  drift is seen.  Reflexes: Deep tendon reflexes are symmetric.   MRI brain 03/22/18:  IMPRESSION: 1. Widespread abnormal cerebral white matter signal, and evidence of associated cervical spinal cord abnormality (#2), strongly suggestive of Multiple Sclerosis in this clinical setting. 2. Demyelinating lesion suspected in the cervical spinal cord at C3. Dedicated Cervical and Thoracic Spine MRI may be valuable.  * MRI scan images were reviewed online. I agree with the written report.    Assessment/Plan:  1.  Multiple sclerosis  The patient has brain and spinal cord lesions on MRI.  She will have MRI of the cervical spine done with and without gadolinium enhancement.  We will check blood work today.  The patient is to go on vitamin D supplementation.  She indicates that she is planning on further pregnancies in the future.  Given this information, we may select a medication that is more safe such as Copaxone.  The patient has signed a form today.  We will have blood work done today.  She will follow-up for next scheduled visit.  Marlan Palau MD 03/26/2018 2:58 PM  [facility] [address] [place], [place] [address]  Phone [phone] Fax [phone]

## 2018-03-26 NOTE — Progress Notes (Signed)
Placed [facility] in [facility] lock box for routine pick up. 

## 2018-03-27 ENCOUNTER — Telehealth: Payer: Self-pay | Admitting: *Deleted

## 2018-03-27 NOTE — Telephone Encounter (Signed)
Received fax notification from [employer] that they received rx/start form for Copaxone.

## 2018-03-27 NOTE — Telephone Encounter (Signed)
Faxed completed/signed Copaxone start form to [employer] at [phone]. Received fax confirmation. Dr. Anne Hahn signed w/ brand exchange permissible.  Dx: Multiple Sclerosis- relapsing remitting. ICD10: G35

## 2018-03-28 ENCOUNTER — Telehealth: Payer: Self-pay | Admitting: Neurology

## 2018-03-28 NOTE — Telephone Encounter (Signed)
I called the patient.  MRI of the cervical spine shows an enhancing lesion in the C3-4 level this likely is the source of her current symptoms.  She is having some worsening of burning sensations in the left arm.  If her symptoms worsen throughout the weekend, she is to contact our office and we will consider a 3-day course of Solu-Medrol.  We are working on getting her started on Copaxone.   MRI cervical 03/27/18:  IMPRESSION: This MRI of the cervical spine with and without contrast shows the following: 1.   Enhancing focus in the posterior columns of the spinal cord adjacent to C3-C4 associated with mild focal enlargement of the cord.    This is most consistent with an acute demyelinating lesion as would be seen with multiple sclerosis.    2.   No significant degenerative changes are noted in the spine.   There is no nerve root compression or spinal stenosis.

## 2018-03-31 ENCOUNTER — Telehealth: Payer: Self-pay | Admitting: Neurology

## 2018-03-31 DIAGNOSIS — G35 Multiple sclerosis: Secondary | ICD-10-CM | POA: Diagnosis not present

## 2018-03-31 NOTE — Telephone Encounter (Signed)
Pt has called back because she has not heard anything in response to her message this morning.  RN Kara Mead confirmed that the message was received but explained Dr Anne Hahn has been with patients throughout the day.  Pt was advised that when Dr Anne Hahn is avail there will be a response to her message, pt Gina Adams

## 2018-03-31 NOTE — Telephone Encounter (Signed)
JC virus antibody is negative, index value is 0.10

## 2018-03-31 NOTE — Telephone Encounter (Signed)
Patient calling stating symptoms worsened over the weekend and would like to have 3 day course of Solu-Medrol as stated in previous message.

## 2018-03-31 NOTE — Telephone Encounter (Signed)
I called the patient.  The patient has had ongoing symptoms involving the hands with tingling and burning, she is not have any significant change in her balance or bladder control.  Leg symptoms have improved with oral prednisone.  I will get her in for a 3-day course of Solu-Medrol, 500 mg daily.

## 2018-04-01 ENCOUNTER — Telehealth: Payer: Self-pay | Admitting: *Deleted

## 2018-04-01 DIAGNOSIS — G35 Multiple sclerosis: Secondary | ICD-10-CM | POA: Diagnosis not present

## 2018-04-01 NOTE — Telephone Encounter (Signed)
Received notice from [employer] that they were unable to verify current copaxone status despite multiple outreach attempts.   I called pt and relayed information above. I advised her to call them back at [phone]. Asked her to call back if she has further questions or concerns.

## 2018-04-02 DIAGNOSIS — G35 Multiple sclerosis: Secondary | ICD-10-CM | POA: Diagnosis not present

## 2018-04-02 LAB — CBC WITH DIFFERENTIAL/PLATELET
BASOS: 0 %
Basophils Absolute: 0 10*3/uL (ref 0.0–0.2)
EOS (ABSOLUTE): 0 10*3/uL (ref 0.0–0.4)
EOS: 0 %
HEMATOCRIT: 37.8 % (ref 34.0–46.6)
HEMOGLOBIN: 13 g/dL (ref 11.1–15.9)
IMMATURE GRANULOCYTES: 0 %
Immature Grans (Abs): 0 10*3/uL (ref 0.0–0.1)
LYMPHS ABS: 1.3 10*3/uL (ref 0.7–3.1)
Lymphs: 15 %
MCH: 29.3 pg (ref 26.6–33.0)
MCHC: 34.4 g/dL (ref 31.5–35.7)
MCV: 85 fL (ref 79–97)
MONOCYTES: 3 %
MONOS ABS: 0.3 10*3/uL (ref 0.1–0.9)
NEUTROS PCT: 82 %
Neutrophils Absolute: 7 10*3/uL (ref 1.4–7.0)
Platelets: 372 10*3/uL (ref 150–379)
RBC: 4.43 x10E6/uL (ref 3.77–5.28)
RDW: 14 % (ref 12.3–15.4)
WBC: 8.6 10*3/uL (ref 3.4–10.8)

## 2018-04-02 LAB — COMPREHENSIVE METABOLIC PANEL
ALBUMIN: 4.7 g/dL (ref 3.5–5.5)
ALT: 13 IU/L (ref 0–32)
AST: 15 IU/L (ref 0–40)
Albumin/Globulin Ratio: 2 (ref 1.2–2.2)
Alkaline Phosphatase: 83 IU/L (ref 39–117)
BUN / CREAT RATIO: 18 (ref 9–23)
BUN: 12 mg/dL (ref 6–20)
Bilirubin Total: 0.2 mg/dL (ref 0.0–1.2)
CALCIUM: 9 mg/dL (ref 8.7–10.2)
CO2: 20 mmol/L (ref 20–29)
CREATININE: 0.65 mg/dL (ref 0.57–1.00)
Chloride: 101 mmol/L (ref 96–106)
GFR calc Af Amer: 141 mL/min/{1.73_m2} (ref >59)
GFR, EST NON AFRICAN AMERICAN: 122 mL/min/{1.73_m2} (ref >59)
GLOBULIN, TOTAL: 2.4 g/dL (ref 1.5–4.5)
GLUCOSE: 96 mg/dL (ref 65–99)
Potassium: 4.4 mmol/L (ref 3.5–5.2)
SODIUM: 138 mmol/L (ref 134–144)
Total Protein: 7.1 g/dL (ref 6.0–8.5)

## 2018-04-02 LAB — QUANTIFERON-TB GOLD PLUS
QuantiFERON Mitogen Value: 5.2 IU/mL
QuantiFERON Nil Value: 0.02 IU/mL
QuantiFERON TB1 Ag Value: 0.02 IU/mL
QuantiFERON TB2 Ag Value: 0.01 IU/mL
QuantiFERON-TB Gold Plus: NEGATIVE

## 2018-04-02 LAB — VARICELLA ZOSTER ANTIBODY, IGG: VARICELLA: 1262 {index} (ref >165)

## 2018-04-02 LAB — LUPUS ANTICOAGULANT
DPT CONFIRM RATIO: 1.05 ratio (ref 0.00–1.40)
Dilute Viper Venom Time: 30.3 s (ref 0.0–47.0)
PTT Lupus Anticoagulant: 31.5 s (ref 0.0–51.9)
THROMBIN TIME: 16.7 s (ref 0.0–23.0)
dPT: 39.1 s (ref 0.0–55.0)

## 2018-04-02 LAB — RPR: RPR: NONREACTIVE

## 2018-04-02 LAB — HEPATITIS B CORE ANTIBODY, TOTAL: HEP B C TOTAL AB: NEGATIVE

## 2018-04-02 LAB — ANTI-MYELIN ASSOC GLYCOP IGG: Anti-Myelin Assoc Glycop IgG: <1:10 {titer}

## 2018-04-02 LAB — HIV ANTIBODY (ROUTINE TESTING W REFLEX): HIV Screen 4th Generation wRfx: NONREACTIVE

## 2018-04-02 LAB — SEDIMENTATION RATE: Sed Rate: 3 mm/hr (ref 0–32)

## 2018-04-02 LAB — ANA W/REFLEX: Anti Nuclear Antibody(ANA): NEGATIVE

## 2018-04-02 LAB — ANGIOTENSIN CONVERTING ENZYME: Angio Convert Enzyme: 35 U/L (ref 14–82)

## 2018-04-02 LAB — B. BURGDORFI ANTIBODIES: Lyme IgG/IgM Ab: <0.91 {ISR} (ref 0.00–0.90)

## 2018-04-02 LAB — HEPATITIS C ANTIBODY: Hep C Virus Ab: <0.1 s/co ratio (ref 0.0–0.9)

## 2018-04-03 ENCOUNTER — Telehealth: Payer: Self-pay | Admitting: *Deleted

## 2018-04-03 NOTE — Telephone Encounter (Signed)
Spoke with patient and discussed that her labs are unremarkable, no concerns. She verbalized understanding. Patient also asked the process for getting medical records including diagnosis. RN advised pt to come by office, ask for Stanton Kidney in medical records who have the patient sign a release form and then get her records. Patient appreciative and had no further questions.

## 2018-04-03 NOTE — Telephone Encounter (Signed)
-----   Message from York Spaniel, MD sent at 04/02/2018  5:13 PM EDT -----   The blood work results are unremarkable. Please call the patient.  ----- Message ----- From: Nell Range Lab Results In Sent: 03/27/2018   7:40 AM To: York Spaniel, MD

## 2018-04-04 ENCOUNTER — Telehealth: Payer: Self-pay | Admitting: Neurology

## 2018-04-04 NOTE — Telephone Encounter (Addendum)
Called pt back. Handwriting has improved. Pain/burning sensation in neck getting worse. Pins/needles in hands. received copaxone and started on 04/02/18. She will take her second dose today. She received IV steroids 03/31/18, 04/01/18, 04/02/18 and now finishing her oral prednisone 10mg  tablet. She has two days left of the taper pack.  She wanted to make sure there was nothing else she should do, she was concerned.  I advised prednisone will continue to work even after she finishes her last dose. Advised I will speak w/ Dr. Epimenio Foot here since Dr. Anne Hahn out. He also specializes in MS. I will call her back to let her know if we need to do anything more. She verbalized understanding.  Per Dr. Epimenio Foot- sensory changes normal ecspecially w/ recent exacerbation. Neck pain could be related to exacerbation or other issues. Okay to monitor for now. She can call after hours if she has new or worsening symptoms or proceed to ED if severe.   I called patient back. Relayed message per Dr. Epimenio Foot. She verbalized understanding and appreciation for call.

## 2018-04-04 NOTE — Telephone Encounter (Signed)
Pt has called stating re: her Exacerbation she has a the feeling of pins and needles in her hands and her neck is very sore.  Pt is asking if this is normal or expected to worsen or get better.  Please call

## 2018-04-07 MED ORDER — GABAPENTIN 300 MG PO CAPS: ORAL_CAPSULE | ORAL | 1 refills | Status: DC

## 2018-04-07 NOTE — Telephone Encounter (Signed)
Pt returning RNs call, requesting she return her call when available

## 2018-04-07 NOTE — Telephone Encounter (Addendum)
Spoke w/ Dr. Epimenio Foot. Received VO for the following: Gabapentin 300mg  TID. Pt should should titate up on the full dose as follows:  take 300mg  at bedtime. After a couple days take 300mg  at dinner and bedtime. Then after a couple days, can increase to full dose taking 300mg  in the am, dinner and at bedtime.   Pt should be worked in to see Dr. Anne Hahn within the next week per Dr. Epimenio Foot. Will have to watch for cx and speak w/ Dr. Anne Hahn tomorrow to see where we can fit her in.   I called and LVM for pt returning her call

## 2018-04-07 NOTE — Addendum Note (Signed)
Addended by: Hillis Range on: 04/07/2018 02:36 PM   Modules accepted: Orders

## 2018-04-07 NOTE — Telephone Encounter (Signed)
Faxed printed/signed rx [facility]ap[facility] 300mg  [facility] [facility] [phone]. Received fax confirmation.

## 2018-04-07 NOTE — Telephone Encounter (Addendum)
Called and spoke w/ patient. Sx intermittent. Had brief problem swallowing. Noticed some vision changes while trying to watch tv. Felt like the tv was "moving".  No longer having swallowing problems. Only had the one episodeVision has improved. . Still having neck pain, intermittent. Laying down helps. She is also very fatigued. Her husband is home today from work.  She stated allergy medication recently. Still doing okay on copaxone. Took another dose today.   She is agreeable to try gabapentin. I went over SE. Verified pharmacy on file.   Made f/u for her to see Dr. Anne Hahn on 04/11/18 at 8am, check in no later than 745am.  Printed rx gabapentin. Awaiting MD signature.

## 2018-04-07 NOTE — Telephone Encounter (Signed)
Pt called states she is not feeling any better. She is wanting to know if what she feels will be the "norm" for her since this was her 1st exacerbation. Pt is very concerned. Please call to advise

## 2018-04-10 ENCOUNTER — Encounter: Payer: Self-pay | Admitting: Internal Medicine

## 2018-04-10 ENCOUNTER — Ambulatory Visit: Payer: BLUE CROSS/BLUE SHIELD | Admitting: Internal Medicine

## 2018-04-10 VITALS — BP 126/86 | HR 122 | Temp 98.5°F | Ht 62.0 in | Wt 178.0 lb

## 2018-04-10 DIAGNOSIS — G35 Multiple sclerosis: Secondary | ICD-10-CM | POA: Diagnosis not present

## 2018-04-10 DIAGNOSIS — F909 Attention-deficit hyperactivity disorder, unspecified type: Secondary | ICD-10-CM | POA: Diagnosis not present

## 2018-04-10 DIAGNOSIS — F418 Other specified anxiety disorders: Secondary | ICD-10-CM | POA: Diagnosis not present

## 2018-04-10 DIAGNOSIS — Z0000 Encounter for general adult medical examination without abnormal findings: Secondary | ICD-10-CM | POA: Diagnosis not present

## 2018-04-10 DIAGNOSIS — F988 Other specified behavioral and emotional disorders with onset usually occurring in childhood and adolescence: Secondary | ICD-10-CM | POA: Insufficient documentation

## 2018-04-10 MED ORDER — AMPHETAMINE-DEXTROAMPHETAMINE 20 MG PO TABS: ORAL_TABLET | ORAL | 0 refills | Status: DC

## 2018-04-10 MED ORDER — GLATIRAMER ACETATE 40 MG/ML ~~LOC~~ SOSY: PREFILLED_SYRINGE | SUBCUTANEOUS | 11 refills | Status: DC

## 2018-04-10 NOTE — Patient Instructions (Addendum)
Please take all new medication as prescribed - the adderall  /Please continue all other medications as before, and refills have been done if requested.  Please have the pharmacy call with any other refills you may need.  Please continue your efforts at being more active, low cholesterol diet, and weight control.  Please keep your appointments with your specialists as you may have planned  Please return in 1 year for your yearly visit, or sooner if needed, with Lab testing done 3-5 days before

## 2018-04-10 NOTE — Progress Notes (Signed)
Subjective:    Patient ID: Gina Adams, female    DOB: 1991-09-29, 27 y.o.   MRN: [id number]  HPI  Here to f/u; overall doing ok,  Pt denies chest pain, increasing sob or doe, wheezing, orthopnea, PND, increased LE swelling, palpitations, dizziness or syncope.  Pt denies new neurological symptoms such as new headache, or facial or extremity weakness or numbness.  Pt denies polydipsia, polyuria, or low sugar episode.  Pt states overall good compliance with meds, mostly trying to follow appropriate diet, with wt overall down a few lbs with better diet and activity post gravid.   Wt Readings from Last 3 Encounters:  04/11/18 177 lb (80.3 kg)  04/10/18 178 lb (80.7 kg)  03/26/18 184 lb (83.5 kg)  Taking new copaxone, but did not take gabapentin as did not need after last neuro visit.Pt denies new neurological symptoms such as new headache, or facial or extremity weakness or numbness    No longer breast feeding.  Asks to restart ADD med - adderall 20 mg and 10 in pm if needed, as has done very well in past with this with improved concentration and task completion.  No intention for pregnancy soon  Denies worsening depressive symptoms, suicidal ideation, or panic Past Medical History:  Diagnosis Date  . CIN I (cervical intraepithelial neoplasia I) 2012  . Endometriosis   . Multiple sclerosis (HCC) 03/26/2018  . Panic   . Pap smear of cervix with ASCUS, cannot exclude HGSIL 2012   Past Surgical History:  Procedure Laterality Date  . DIAGNOSTIC LAPAROSCOPY    . DILATION AND EVACUATION N/A 08/08/2015   Procedure: DILATATION AND EVACUATION;  Surgeon: Zelphia Cairo, MD;  Location: [facility];  Service: Gynecology;  Laterality: N/A;  . TONSILLECTOMY    . WISDOM TOOTH EXTRACTION     x4    reports that she has been smoking cigarettes.  She has been smoking about 0.50 packs per day. She has never used smokeless tobacco. She reports that she drinks alcohol. She reports that she does not use  drugs. family history includes Anxiety disorder in her brother; Ovarian cancer in her mother; Prostate cancer in her maternal grandfather; Skin cancer in her mother. Allergies  Allergen Reactions  . Seroquel [Quetiapine] Other (See Comments)    Felt like she couldn't get out of bed, felt very weak   No current outpatient medications on file prior to visit.   No current facility-administered medications on file prior to visit.    Review of Systems  Constitutional: Negative for other unusual diaphoresis or sweats HENT: Negative for ear discharge or swelling Eyes: Negative for other worsening visual disturbances Respiratory: Negative for stridor or other swelling  Gastrointestinal: Negative for worsening distension or other blood Genitourinary: Negative for retention or other urinary change Musculoskeletal: Negative for other MSK pain or swelling Skin: Negative for color change or other new lesions Neurological: Negative for worsening tremors and other numbness  Psychiatric/Behavioral: Negative for worsening agitation or other fatigue All other system neg per pt    Objective:   Physical Exam BP 126/86   Pulse (!) 122   Temp 98.5 F (36.9 C) (Oral)   Ht 5\' 2"  (1.575 m)   Wt 178 lb (80.7 kg)   SpO2 98%   BMI 32.56 kg/m  VS noted,  Constitutional: Pt appears in NAD HENT: Head: NCAT.  Right Ear: External ear normal.  Left Ear: External ear normal.  Eyes: . Pupils are equal, round, and reactive to light.  Conjunctivae and EOM are normal Nose: without d/c or deformity Neck: Neck supple. Gross normal ROM Cardiovascular: Normal rate and regular rhythm.   Pulmonary/Chest: Effort normal and breath sounds without rales or wheezing.  Abd:  Soft, NT, ND, + BS, no organomegaly Neurological: Pt is alert. At baseline orientation, motor grossly intact Skin: Skin is warm. No rashes, other new lesions, no LE edema Psychiatric: Pt behavior is normal without agitation , mild nervous No other  exam findings  Lab Results  Component Value Date   WBC 8.6 03/26/2018   HGB 13.0 03/26/2018   HCT 37.8 03/26/2018   PLT 372 03/26/2018   GLUCOSE 96 03/26/2018   ALT 13 03/26/2018   AST 15 03/26/2018   NA 138 03/26/2018   K 4.4 03/26/2018   CL 101 03/26/2018   CREATININE 0.65 03/26/2018   BUN 12 03/26/2018   CO2 20 03/26/2018       Assessment & Plan:

## 2018-04-11 ENCOUNTER — Encounter: Payer: Self-pay | Admitting: Neurology

## 2018-04-11 ENCOUNTER — Ambulatory Visit: Payer: BLUE CROSS/BLUE SHIELD | Admitting: Neurology

## 2018-04-11 ENCOUNTER — Other Ambulatory Visit: Payer: Self-pay

## 2018-04-11 VITALS — BP 144/101 | HR 105 | Ht 62.0 in | Wt 177.0 lb

## 2018-04-11 DIAGNOSIS — G35 Multiple sclerosis: Secondary | ICD-10-CM | POA: Diagnosis not present

## 2018-04-11 DIAGNOSIS — N393 Stress incontinence (female) (male): Secondary | ICD-10-CM

## 2018-04-11 NOTE — Progress Notes (Signed)
Reason for visit: Multiple sclerosis  Gina Adams is an 27 y.o. female  History of present illness:  Gina Adams is a 27 year old right-handed white female with a history of multiple sclerosis.  The patient has an upper cervical spinal cord lesion with symptoms associated with this.  The patient does report a Lhermitte's sign from this.  The patient has had improvement in sensation in the legs, she still has some altered sensation in the hands, the burning sensation in the arms has improved, she never went on the gabapentin.  The patient has had a good improvement in fatigue level.  She is now on Copaxone and she is tolerating the medication well.  The patient has had benefit with the balance, she is doing better in this regard.  She has a history of incontinence of the bladder that has preceded the MS attack, this has been going on for at least 3 months.  The patient denies any incontinence of bowels.  She returns to this office for an evaluation.  The patient reports intermittent episodes of oscillopsia that may occur.  Past Medical History:  Diagnosis Date  . CIN I (cervical intraepithelial neoplasia I) 2012  . Endometriosis   . Multiple sclerosis (HCC) 03/26/2018  . Panic   . Pap smear of cervix with ASCUS, cannot exclude HGSIL 2012    Past Surgical History:  Procedure Laterality Date  . DIAGNOSTIC LAPAROSCOPY    . DILATION AND EVACUATION N/A 08/08/2015   Procedure: DILATATION AND EVACUATION;  Surgeon: Zelphia Cairo, MD;  Location: [facility];  Service: Gynecology;  Laterality: N/A;  . TONSILLECTOMY    . WISDOM TOOTH EXTRACTION     x4    Family History  Problem Relation Age of Onset  . Ovarian cancer Mother   . Skin cancer Mother   . Anxiety disorder Brother   . Prostate cancer Maternal Grandfather     Social history:  reports that she has been smoking cigarettes.  She has been smoking about 0.50 packs per day. She has never used smokeless tobacco. She reports that she  drinks alcohol. She reports that she does not use drugs.    Allergies  Allergen Reactions  . Seroquel [Quetiapine] Other (See Comments)    Felt like she couldn't get out of bed, felt very weak    Medications:  Prior to Admission medications   Medication Sig Start Date End Date Taking? Authorizing Provider  amphetamine-dextroamphetamine (ADDERALL) 20 MG tablet 1 tab by mouth in the AM, and 1/2 tab in PM as needed 04/10/18  Yes Corwin Levins, MD  Glatiramer Acetate (COPAXONE) 40 MG/ML SOSY 1 ml sq three times weekly 04/10/18  Yes Corwin Levins, MD    ROS:  Out of a complete 14 system review of symptoms, the patient complains only of the following symptoms, and all other reviewed systems are negative.  Paresthesias  Blood pressure (!) 144/101, pulse (!) 105, height 5\' 2"  (1.575 m), weight 177 lb (80.3 kg), unknown if currently breastfeeding.  Physical Exam  General: The patient is alert and cooperative at the time of the examination.  The patient is minimally obese.  Skin: No significant peripheral edema is noted.   Neurologic Exam  Mental status: The patient is alert and oriented x 3 at the time of the examination. The patient has apparent normal recent and remote memory, with an apparently normal attention span and concentration ability.   Cranial nerves: Facial symmetry is present. Speech is normal, no  aphasia or dysarthria is noted. Extraocular movements are full. Visual fields are full.  Pupils are equal, round, and reactive to light.  Discs are flat bilaterally.  Motor: The patient has good strength in all 4 extremities.  Sensory examination: Soft touch sensation is symmetric on the face, arms, and legs.  Coordination: The patient has good finger-nose-finger and heel-to-shin bilaterally.  Gait and station: The patient has a normal gait. Tandem gait is normal. Romberg is negative. No drift is seen.  Reflexes: Deep tendon reflexes are symmetric.   MRI brain  03/22/18:  IMPRESSION: 1. Widespread abnormal cerebral white matter signal, and evidence of associated cervical spinal cord abnormality (#2), strongly suggestive of Multiple Sclerosis in this clinical setting. 2. Demyelinating lesion suspected in the cervical spinal cord at C3. Dedicated Cervical and Thoracic Spine MRI may be valuable.  * MRI scan images were reviewed online. I agree with the written report.   MRI cervical 03/26/18:  IMPRESSION: This MRI of the cervical spine with and without contrast shows the following: 1.   Enhancing focus in the posterior columns of the spinal cord adjacent to C3-C4 associated with mild focal enlargement of the cord.    This is most consistent with an acute demyelinating lesion as would be seen with multiple sclerosis.    2.   No significant degenerative changes are noted in the spine.   There is no nerve root compression or spinal stenosis.   * MRI scan images were reviewed online. I agree with the written report.     Assessment/Plan:  1.  Multiple sclerosis  2.  Urinary incontinence  The patient will be sent for a urology evaluation, the patient could potentially benefit from Kegel exercises.  The patient does not wish to go on any medications for her bladder.  The patient will continue the Copaxone, she will follow-up for her next scheduled visit.  We will check MRI evaluation of the brain and cervical spinal cord within the next 6 to 8 months.  Marlan Palau MD 04/11/2018 1:33 PM  [facility] [address] [place], [place] [address]  Phone [phone] Fax [phone]

## 2018-04-13 NOTE — Assessment & Plan Note (Signed)
Ok for adderall asd,  to f/u any worsening symptoms or concerns

## 2018-04-13 NOTE — Assessment & Plan Note (Signed)
Stable, cont f/u with neuro as planned

## 2018-04-13 NOTE — Assessment & Plan Note (Signed)
Mild, declines further tx or referral

## 2018-04-15 ENCOUNTER — Telehealth: Payer: Self-pay | Admitting: Neurology

## 2018-04-15 MED ORDER — PREDNISONE 5 MG PO TABS: ORAL_TABLET | ORAL | 0 refills | Status: DC

## 2018-04-15 NOTE — Telephone Encounter (Signed)
I called the patient.  Over the last 3 days the patient has had neck pain, increased fatigue and slight change in balance.  This is still related to the cervical cord lesion.  I will give her a prolonged low-dose prednisone taper, starting at 20 mg daily for 5 days, 15 mg for 5 days, 10 mg for 5 days, 5 mg for 5 days, and then stop.

## 2018-04-15 NOTE — Telephone Encounter (Signed)
Pt called to advise MS exacerbation has gotten worse over the past 3 days. Please call to advise.

## 2018-05-06 ENCOUNTER — Telehealth: Payer: Self-pay | Admitting: Internal Medicine

## 2018-05-06 NOTE — Telephone Encounter (Signed)
Recv'd records from [facility] forwarded 32 pages to Dr. Oliver Barre

## 2018-05-09 ENCOUNTER — Ambulatory Visit: Payer: BLUE CROSS/BLUE SHIELD | Admitting: Neurology

## 2018-05-09 ENCOUNTER — Encounter

## 2018-05-12 ENCOUNTER — Other Ambulatory Visit: Payer: Self-pay | Admitting: Internal Medicine

## 2018-05-12 NOTE — Telephone Encounter (Signed)
Copied from CRM [id number]. Topic: Quick Communication - See Telephone Encounter >> May 12, 2018  9:09 AM Terisa Starr wrote: CRM for notification. See Telephone encounter for: 05/12/18.  amphetamine-dextroamphetamine (ADDERALL) 20 MG tablet [id number] [place] [place] - [address]

## 2018-05-12 NOTE — Telephone Encounter (Signed)
Adderall refill Last Refill:04/10/18 #45 Last OV: 04/10/18 PCP: Dr. [name] [facility]   [phone] [address]

## 2018-05-13 MED ORDER — AMPHETAMINE-DEXTROAMPHETAMINE 20 MG PO TABS: ORAL_TABLET | ORAL | 0 refills | Status: DC

## 2018-05-13 NOTE — Telephone Encounter (Signed)
Done erx 

## 2018-05-15 ENCOUNTER — Telehealth: Payer: Self-pay | Admitting: Neurology

## 2018-05-15 NOTE — Telephone Encounter (Signed)
I called the patient.  The patient just learned that she is pregnant.  She is on Copaxone, the last 2 injections have resulted in transient areas of induration lasting 2 or 3 days, I indicated that this is not unusual, no indication to stop the medication.  The patient however may decide to stop the drug because of her pregnancy, if she does, she can restart the medication in the mid second or third trimester.

## 2018-05-18 ENCOUNTER — Telehealth: Payer: Self-pay | Admitting: Neurology

## 2018-05-18 NOTE — Telephone Encounter (Signed)
I called the patient.  The patient began having blurring out of vision in the right eye, loss of colors some discomfort in the eye that began 2 days ago.  It appears that she may have an optic neuritis.  I will have her come in for Solu-Medrol, 500 mg daily for 3 days, the patient is pregnant, there is some low risk to the pregnancy, possibly some increased risk of cleft palate.  The patient may need to consider going on another medication other than Copaxone during the pregnancy.  We will probably use a short course of steroids, 3 days of Solu-Medrol and only a 6-day course of prednisone afterwards.

## 2018-05-19 DIAGNOSIS — G35 Multiple sclerosis: Secondary | ICD-10-CM | POA: Diagnosis not present

## 2018-05-19 MED ORDER — PREDNISONE 10 MG PO TABS: ORAL_TABLET | ORAL | 0 refills | Status: DC

## 2018-05-19 NOTE — Telephone Encounter (Signed)
The patient will come in for Solu-Medrol injections, 500 mg daily for 3 days.  I will call in a 6-day course of prednisone.

## 2018-05-19 NOTE — Telephone Encounter (Signed)
I called the patient.  The patient is to take the prednisone after the Solu-Medrol, we will stay on Copaxone for now she will not stop the drug.  We may consider other more potent therapies down the road such as Tysabri or Ocrevus.

## 2018-05-19 NOTE — Telephone Encounter (Signed)
Patient had an infusion today and wants to know if she should start taking predniSONE (DELTASONE) 10 MG tablet today. She would also like to discuss discontinuing Glatiramer Acetate (COPAXONE) 40 MG/ML SOSY as discussed in previous  message.

## 2018-05-20 DIAGNOSIS — G35 Multiple sclerosis: Secondary | ICD-10-CM | POA: Diagnosis not present

## 2018-05-21 DIAGNOSIS — G35 Multiple sclerosis: Secondary | ICD-10-CM | POA: Diagnosis not present

## 2018-05-22 DIAGNOSIS — Z6839 Body mass index (BMI) 39.0-39.9, adult: Secondary | ICD-10-CM | POA: Diagnosis not present

## 2018-05-22 DIAGNOSIS — Z3687 Encounter for antenatal screening for uncertain dates: Secondary | ICD-10-CM | POA: Diagnosis not present

## 2018-05-22 DIAGNOSIS — Z3481 Encounter for supervision of other normal pregnancy, first trimester: Secondary | ICD-10-CM | POA: Diagnosis not present

## 2018-05-22 DIAGNOSIS — Z3480 Encounter for supervision of other normal pregnancy, unspecified trimester: Secondary | ICD-10-CM | POA: Diagnosis not present

## 2018-05-29 DIAGNOSIS — G35 Multiple sclerosis: Secondary | ICD-10-CM | POA: Diagnosis not present

## 2018-06-09 DIAGNOSIS — Z113 Encounter for screening for infections with a predominantly sexual mode of transmission: Secondary | ICD-10-CM | POA: Diagnosis not present

## 2018-06-09 DIAGNOSIS — Z3481 Encounter for supervision of other normal pregnancy, first trimester: Secondary | ICD-10-CM | POA: Diagnosis not present

## 2018-06-13 ENCOUNTER — Telehealth: Payer: Self-pay

## 2018-06-13 ENCOUNTER — Other Ambulatory Visit: Payer: Self-pay | Admitting: Internal Medicine

## 2018-06-13 DIAGNOSIS — R300 Dysuria: Secondary | ICD-10-CM | POA: Diagnosis not present

## 2018-06-13 DIAGNOSIS — N390 Urinary tract infection, site not specified: Secondary | ICD-10-CM | POA: Diagnosis not present

## 2018-06-13 DIAGNOSIS — N912 Amenorrhea, unspecified: Secondary | ICD-10-CM | POA: Diagnosis not present

## 2018-06-13 MED ORDER — AMPHETAMINE-DEXTROAMPHETAMINE 20 MG PO TABS: ORAL_TABLET | ORAL | 0 refills | Status: DC

## 2018-06-13 NOTE — Telephone Encounter (Signed)
Copied from CRM #[id number]. Topic: Inquiry °>> Jun 13, 2018 10:40 AM Robinson, Andra M wrote: °Reason for CRM: Patient called requesting a refill of Amphetamine-dextroamphetamine (ADDERALL) 20 MG tablet. [id number] [place], [place] - [address] [phone] (Phone)  [phone] (Fax).       Thank °You!!! ° ° ° ° ° °

## 2018-06-13 NOTE — Telephone Encounter (Signed)
Rx refill request: Adderall 20 mg    Last filled: 05/13/18  LOV: 04/10/18  PCP: [name]  Pharmacy: verified

## 2018-06-13 NOTE — Telephone Encounter (Signed)
Done erx 

## 2018-06-13 NOTE — Addendum Note (Signed)
Addended by: Corwin Levins on: 06/13/2018 11:26 AM   Modules accepted: Orders

## 2018-06-13 NOTE — Telephone Encounter (Signed)
Copied from CRM [id number]. Topic: Inquiry >> Jun 13, 2018 10:40 AM Yvonna Alanis wrote: Reason for CRM: Patient called requesting a refill of Amphetamine-dextroamphetamine (ADDERALL) 20 MG tablet. [facility] [phone] [place], [place] - [address] [phone] (Phone)  [phone] (Fax).       Thank You!!!

## 2018-06-19 DIAGNOSIS — Z36 Encounter for antenatal screening for chromosomal anomalies: Secondary | ICD-10-CM | POA: Diagnosis not present

## 2018-06-19 DIAGNOSIS — R300 Dysuria: Secondary | ICD-10-CM | POA: Diagnosis not present

## 2018-06-19 DIAGNOSIS — O2341 Unspecified infection of urinary tract in pregnancy, first trimester: Secondary | ICD-10-CM | POA: Diagnosis not present

## 2018-06-19 DIAGNOSIS — Z3682 Encounter for antenatal screening for nuchal translucency: Secondary | ICD-10-CM | POA: Diagnosis not present

## 2018-06-24 DIAGNOSIS — G35 Multiple sclerosis: Secondary | ICD-10-CM | POA: Diagnosis not present

## 2018-07-03 DIAGNOSIS — Z0271 Encounter for disability determination: Secondary | ICD-10-CM

## 2018-07-11 ENCOUNTER — Other Ambulatory Visit: Payer: Self-pay | Admitting: Internal Medicine

## 2018-07-11 NOTE — Telephone Encounter (Signed)
Adderall refill Last Refill:06/13/18 #45 Last OV: 04/10/18 PCP: Dr. [name] [facility] [employer]

## 2018-07-11 NOTE — Telephone Encounter (Signed)
Copied from CRM [id number]. Topic: Quick Communication - Rx Refill/Question >> Jul 11, 2018  1:49 PM Oneal Grout wrote: Medication: amphetamine-dextroamphetamine (ADDERALL) 20 MG tablet  Has the patient contacted their pharmacy? Yes.   (Agent: If no, request that the patient contact the pharmacy for the refill.) (Agent: If yes, when and what did the pharmacy advise?)  Preferred Pharmacy (with phone number [facility] street name[facility]  Agent: Please be advised that RX refills may take up to 3 business days. We ask that you follow-up with your pharmacy.

## 2018-07-14 MED ORDER — AMPHETAMINE-DEXTROAMPHETAMINE 20 MG PO TABS: ORAL_TABLET | ORAL | 0 refills | Status: DC

## 2018-07-14 NOTE — Telephone Encounter (Signed)
Done erx 

## 2018-07-17 DIAGNOSIS — Z363 Encounter for antenatal screening for malformations: Secondary | ICD-10-CM | POA: Diagnosis not present

## 2018-07-17 DIAGNOSIS — Z8759 Personal history of other complications of pregnancy, childbirth and the puerperium: Secondary | ICD-10-CM | POA: Diagnosis not present

## 2018-07-21 ENCOUNTER — Ambulatory Visit: Payer: BLUE CROSS/BLUE SHIELD | Admitting: Internal Medicine

## 2018-07-21 ENCOUNTER — Ambulatory Visit: Payer: BLUE CROSS/BLUE SHIELD | Admitting: Neurology

## 2018-07-21 DIAGNOSIS — H6503 Acute serous otitis media, bilateral: Secondary | ICD-10-CM | POA: Diagnosis not present

## 2018-07-21 DIAGNOSIS — Z3A16 16 weeks gestation of pregnancy: Secondary | ICD-10-CM | POA: Diagnosis not present

## 2018-07-21 DIAGNOSIS — Z0289 Encounter for other administrative examinations: Secondary | ICD-10-CM

## 2018-08-12 ENCOUNTER — Telehealth: Payer: Self-pay | Admitting: Internal Medicine

## 2018-08-12 MED ORDER — AMPHETAMINE-DEXTROAMPHETAMINE 20 MG PO TABS: ORAL_TABLET | ORAL | 0 refills | Status: DC

## 2018-08-12 NOTE — Telephone Encounter (Signed)
Done erx 

## 2018-08-12 NOTE — Telephone Encounter (Signed)
Copied from CRM [id number]. Topic: Quick Communication - Rx Refill/Question >> Aug 12, 2018  8:01 AM Luanna Cole wrote: Medication:amphetamine-dextroamphetamine (ADDERALL) 20 MG tablet [[id number]]   Has the patient contacted their pharmacy? No  Preferred Pharmacy (with phone number or street name):[facility] [phone] [place], [place] - [address] [phone] (Phone) [phone] (Fax)  Agent: Please be advised that RX refills may take up to 3 business days. We ask that you follow-up with your pharmacy.

## 2018-08-12 NOTE — Telephone Encounter (Signed)
MD approved and sent electronically to pof../lmb  

## 2018-08-12 NOTE — Addendum Note (Signed)
Addended by: Corwin Levins on: 08/12/2018 12:42 PM   Modules accepted: Orders

## 2018-08-12 NOTE — Telephone Encounter (Signed)
Adderall refill Last Refill:07/14/18 # 45 Last OV: 04/11/18 PCP: Oliver Barre [facility] [phone]

## 2018-08-14 DIAGNOSIS — Z363 Encounter for antenatal screening for malformations: Secondary | ICD-10-CM | POA: Diagnosis not present

## 2018-08-15 ENCOUNTER — Telehealth: Payer: Self-pay | Admitting: Internal Medicine

## 2018-08-15 MED ORDER — AMPHETAMINE-DEXTROAMPHETAMINE 20 MG PO TABS: ORAL_TABLET | ORAL | 0 refills | Status: DC

## 2018-08-15 NOTE — Telephone Encounter (Signed)
Can script be printed for pt to pick up?

## 2018-08-15 NOTE — Telephone Encounter (Signed)
Patient's Pharmacy is requesting pre approval  The pharmacy is [facility] [address]  [phone].  Please advise Patient's call back number is [phone]

## 2018-08-15 NOTE — Telephone Encounter (Signed)
Pt informed script at front desk

## 2018-08-15 NOTE — Addendum Note (Signed)
Addended by: Corwin Levins on: 08/15/2018 02:56 PM   Modules accepted: Orders

## 2018-08-15 NOTE — Telephone Encounter (Signed)
Copied from CRM [id number]. Topic: General - Other >> Aug 15, 2018 12:55 PM Leafy Ro wrote: Reason for CRM: pt can not take adderall from [employer] due to headaches. Pt would like new rx [facility] and other manufacture choices

## 2018-08-15 NOTE — Telephone Encounter (Signed)
Done erx 

## 2018-08-19 NOTE — Telephone Encounter (Signed)
Approval # given to pharmacist.

## 2018-09-09 DIAGNOSIS — Z23 Encounter for immunization: Secondary | ICD-10-CM | POA: Diagnosis not present

## 2018-09-09 DIAGNOSIS — O139 Gestational [pregnancy-induced] hypertension without significant proteinuria, unspecified trimester: Secondary | ICD-10-CM | POA: Diagnosis not present

## 2018-09-09 DIAGNOSIS — O3680X1 Pregnancy with inconclusive fetal viability, fetus 1: Secondary | ICD-10-CM | POA: Diagnosis not present

## 2018-09-11 DIAGNOSIS — O139 Gestational [pregnancy-induced] hypertension without significant proteinuria, unspecified trimester: Secondary | ICD-10-CM | POA: Diagnosis not present

## 2018-09-15 ENCOUNTER — Other Ambulatory Visit: Payer: Self-pay | Admitting: Internal Medicine

## 2018-09-15 MED ORDER — AMPHETAMINE-DEXTROAMPHETAMINE 20 MG PO TABS: ORAL_TABLET | ORAL | 0 refills | Status: DC

## 2018-09-15 NOTE — Telephone Encounter (Signed)
Done erx 

## 2018-09-15 NOTE — Telephone Encounter (Signed)
Requested medication (s) are due for refill today: yes  Requested medication (s) are on the active medication list: yes  Last refill:  Sept 4, 2019?  Future visit scheduled: yes  Notes to clinic:  This med can not be delegated. No urine drug screen in the last 360 days.  Requested Prescriptions  Pending Prescriptions Disp Refills   amphetamine-dextroamphetamine (ADDERALL) 20 MG tablet 45 tablet 0    Sig: 1 tab by mouth in the AM, and 1/2 tab in PM as needed     Not Delegated - Psychiatry:  Stimulants/ADHD Failed - 09/15/2018  9:08 AM      Failed - This refill cannot be delegated      Failed - Urine Drug Screen completed in last 360 days.      Failed - Valid encounter within last 3 months    Recent Outpatient Visits          5 months ago Attention deficit hyperactivity disorder (ADHD), unspecified ADHD type   [facility] -[name], MD   6 months ago Preventative health care   [facility] -[name], MD      Future Appointments            In 6 months [name], [name], MD [facility] -[name], [name]

## 2018-09-15 NOTE — Telephone Encounter (Signed)
Copied from CRM [id number]. Topic: Quick Communication - Rx Refill/Question >> Sep 15, 2018  8:48 AM Luanna Cole wrote: Medication: amphetamine-dextroamphetamine (ADDERALL) 20 MG tablet [[id number]]   H Preferred Pharmacy (with phone number or street name): [facility] [phone] [place], [place] - [address] [phone] (Phone) [phone] (Fax)    Agent: Please be advised that RX refills may take up to 3 business days. We ask that you follow-up with your pharmacy.

## 2018-09-15 NOTE — Telephone Encounter (Signed)
MD approved and sent electronically to pof../lmb  

## 2018-10-06 DIAGNOSIS — Z3402 Encounter for supervision of normal first pregnancy, second trimester: Secondary | ICD-10-CM | POA: Diagnosis not present

## 2018-10-06 DIAGNOSIS — O26842 Uterine size-date discrepancy, second trimester: Secondary | ICD-10-CM | POA: Diagnosis not present

## 2018-10-10 ENCOUNTER — Telehealth: Payer: Self-pay | Admitting: Internal Medicine

## 2018-10-10 NOTE — Telephone Encounter (Signed)
MD is put of the office today. Will hold until he return Monday since refill is not due until 11/10.Marland KitchenRaechel Chute

## 2018-10-10 NOTE — Telephone Encounter (Signed)
Copied from CRM [id number]. Topic: Quick Communication - Rx Refill/Question >> Oct 10, 2018  9:09 AM Maia Petties wrote: Medication: amphetamine-dextroamphetamine (ADDERALL) 20 MG tablet  - pt has medication until 10/19/18 but wants to make sure she doesn't run out  Has the patient contacted their pharmacy? No - controlled Preferred Pharmacy (with phone number or street name): [id number] [place], [place] - [address] [phone] (Phone) [phone] (Fax)

## 2018-10-16 DIAGNOSIS — Z3A29 29 weeks gestation of pregnancy: Secondary | ICD-10-CM | POA: Diagnosis not present

## 2018-10-16 DIAGNOSIS — O139 Gestational [pregnancy-induced] hypertension without significant proteinuria, unspecified trimester: Secondary | ICD-10-CM | POA: Diagnosis not present

## 2018-10-16 DIAGNOSIS — G35 Multiple sclerosis: Secondary | ICD-10-CM | POA: Diagnosis not present

## 2018-10-16 DIAGNOSIS — O163 Unspecified maternal hypertension, third trimester: Secondary | ICD-10-CM | POA: Diagnosis not present

## 2018-10-17 DIAGNOSIS — O360931 Maternal care for other rhesus isoimmunization, third trimester, fetus 1: Secondary | ICD-10-CM | POA: Diagnosis not present

## 2018-10-17 NOTE — Telephone Encounter (Addendum)
Relation to pt: self Call back [phone] [id number] [place] [place] - [address]  [address] [place] [address]  Phone: [phone] Fax: [phone]  Not a 24 hour pharmacy; exact hours not known.     Reason for call:  Patient checking on the status of medication refill request mentioned below, please advise

## 2018-10-20 DIAGNOSIS — F418 Other specified anxiety disorders: Secondary | ICD-10-CM | POA: Diagnosis not present

## 2018-10-20 DIAGNOSIS — Z3A29 29 weeks gestation of pregnancy: Secondary | ICD-10-CM | POA: Diagnosis not present

## 2018-10-20 DIAGNOSIS — O169 Unspecified maternal hypertension, unspecified trimester: Secondary | ICD-10-CM | POA: Diagnosis not present

## 2018-10-20 DIAGNOSIS — O139 Gestational [pregnancy-induced] hypertension without significant proteinuria, unspecified trimester: Secondary | ICD-10-CM | POA: Diagnosis not present

## 2018-10-20 DIAGNOSIS — R809 Proteinuria, unspecified: Secondary | ICD-10-CM | POA: Diagnosis not present

## 2018-10-23 MED ORDER — AMPHETAMINE-DEXTROAMPHETAMINE 20 MG PO TABS: ORAL_TABLET | ORAL | 0 refills | Status: DC

## 2018-10-23 NOTE — Telephone Encounter (Signed)
I am taking this to a CMA now to have this fixed. Patient has called again to follow up on this.

## 2018-10-23 NOTE — Telephone Encounter (Signed)
Done erx 

## 2018-10-23 NOTE — Addendum Note (Signed)
Addended by: Corwin Levins on: 10/23/2018 12:55 PM   Modules accepted: Orders

## 2018-11-18 ENCOUNTER — Other Ambulatory Visit: Payer: Self-pay | Admitting: Internal Medicine

## 2018-11-18 DIAGNOSIS — R102 Pelvic and perineal pain: Secondary | ICD-10-CM | POA: Diagnosis not present

## 2018-11-18 DIAGNOSIS — M545 Low back pain: Secondary | ICD-10-CM | POA: Diagnosis not present

## 2018-11-18 DIAGNOSIS — Z3A33 33 weeks gestation of pregnancy: Secondary | ICD-10-CM | POA: Diagnosis not present

## 2018-11-18 MED ORDER — AMPHETAMINE-DEXTROAMPHETAMINE 20 MG PO TABS: ORAL_TABLET | ORAL | 0 refills | Status: DC

## 2018-11-18 NOTE — Telephone Encounter (Signed)
Done erx 

## 2018-11-18 NOTE — Telephone Encounter (Signed)
Copied from CRM [id number]. Topic: Quick Communication - Rx Refill/Question >> Nov 18, 2018 11:14 AM Tamela Oddi wrote: Medication: mphetamine-dextroamphetamine (ADDERALL) 20 MG tablet  Patient called to request a refill for the above medication  Preferred Pharmacy (with phone number or street name): [id number] [place], [place] - [address] [phone] (Phone) [phone] (Fax)

## 2018-11-20 DIAGNOSIS — Z364 Encounter for antenatal screening for fetal growth retardation: Secondary | ICD-10-CM | POA: Diagnosis not present

## 2018-11-20 DIAGNOSIS — O0993 Supervision of high risk pregnancy, unspecified, third trimester: Secondary | ICD-10-CM | POA: Diagnosis not present

## 2018-12-08 DIAGNOSIS — Z3493 Encounter for supervision of normal pregnancy, unspecified, third trimester: Secondary | ICD-10-CM | POA: Diagnosis not present

## 2018-12-16 DIAGNOSIS — O139 Gestational [pregnancy-induced] hypertension without significant proteinuria, unspecified trimester: Secondary | ICD-10-CM | POA: Diagnosis not present

## 2018-12-18 DIAGNOSIS — O99344 Other mental disorders complicating childbirth: Secondary | ICD-10-CM | POA: Diagnosis not present

## 2018-12-18 DIAGNOSIS — Z3A38 38 weeks gestation of pregnancy: Secondary | ICD-10-CM | POA: Diagnosis not present

## 2018-12-18 DIAGNOSIS — Z6741 Type O blood, Rh negative: Secondary | ICD-10-CM | POA: Diagnosis not present

## 2018-12-18 DIAGNOSIS — Z79899 Other long term (current) drug therapy: Secondary | ICD-10-CM | POA: Diagnosis not present

## 2018-12-18 DIAGNOSIS — F17210 Nicotine dependence, cigarettes, uncomplicated: Secondary | ICD-10-CM | POA: Diagnosis not present

## 2018-12-18 DIAGNOSIS — F419 Anxiety disorder, unspecified: Secondary | ICD-10-CM | POA: Diagnosis not present

## 2018-12-18 DIAGNOSIS — O134 Gestational [pregnancy-induced] hypertension without significant proteinuria, complicating childbirth: Secondary | ICD-10-CM | POA: Diagnosis not present

## 2018-12-18 DIAGNOSIS — O99354 Diseases of the nervous system complicating childbirth: Secondary | ICD-10-CM | POA: Diagnosis not present

## 2018-12-18 DIAGNOSIS — O26893 Other specified pregnancy related conditions, third trimester: Secondary | ICD-10-CM | POA: Diagnosis not present

## 2018-12-18 DIAGNOSIS — G35 Multiple sclerosis: Secondary | ICD-10-CM | POA: Diagnosis not present

## 2018-12-18 DIAGNOSIS — F319 Bipolar disorder, unspecified: Secondary | ICD-10-CM | POA: Diagnosis not present

## 2018-12-18 DIAGNOSIS — O99334 Smoking (tobacco) complicating childbirth: Secondary | ICD-10-CM | POA: Diagnosis not present

## 2018-12-18 DIAGNOSIS — O133 Gestational [pregnancy-induced] hypertension without significant proteinuria, third trimester: Secondary | ICD-10-CM | POA: Diagnosis not present

## 2018-12-18 DIAGNOSIS — O139 Gestational [pregnancy-induced] hypertension without significant proteinuria, unspecified trimester: Secondary | ICD-10-CM | POA: Diagnosis not present

## 2018-12-22 ENCOUNTER — Telehealth: Payer: Self-pay | Admitting: Internal Medicine

## 2018-12-22 MED ORDER — AMPHETAMINE-DEXTROAMPHETAMINE 20 MG PO TABS: ORAL_TABLET | ORAL | 0 refills | Status: DC

## 2018-12-22 NOTE — Telephone Encounter (Signed)
Copied from CRM [id number]. Topic: Quick Communication - Rx Refill/Question >> Dec 22, 2018  7:59 AM Crist Infante wrote: Medication: amphetamine-dextroamphetamine (ADDERALL) 20 MG tablet  [phone] [place], [place] - [address] [phone] (Phone) [phone] (Fax)

## 2018-12-22 NOTE — Telephone Encounter (Signed)
Done erx 

## 2018-12-22 NOTE — Telephone Encounter (Signed)
   LOV:04/10/18 NextOV: 03/24/19 Last Filled/Quantity:11/22/18 45#

## 2019-01-19 ENCOUNTER — Other Ambulatory Visit: Payer: Self-pay | Admitting: Internal Medicine

## 2019-01-19 NOTE — Telephone Encounter (Unsigned)
Copied from CRM [id number]. Topic: Quick Communication - Rx Refill/Question >> Jan 19, 2019  3:48 PM Arlyss Gandy, NT wrote: Medication: amphetamine-dextroamphetamine (ADDERALL) 20 MG tablet  Has the patient contacted their pharmacy? Yes.   (Agent: If no, request that the patient contact the pharmacy for the refill.) (Agent: If yes, when and what did the pharmacy advise?)  Preferred Pharmacy (with phone number or street name): [phone] [place], [place] - [address] [phone] (Phone) [phone] (Fax)    Agent: Please be advised that RX refills may take up to 3 business days. We ask that you follow-up with your pharmacy.

## 2019-01-20 MED ORDER — AMPHETAMINE-DEXTROAMPHETAMINE 20 MG PO TABS: ORAL_TABLET | ORAL | 0 refills | Status: DC

## 2019-01-20 NOTE — Telephone Encounter (Signed)
Done erx 

## 2019-01-25 ENCOUNTER — Other Ambulatory Visit: Payer: Self-pay

## 2019-01-25 ENCOUNTER — Encounter (HOSPITAL_COMMUNITY): Payer: Self-pay | Admitting: Emergency Medicine

## 2019-01-25 ENCOUNTER — Emergency Department (HOSPITAL_COMMUNITY): Payer: BLUE CROSS/BLUE SHIELD

## 2019-01-25 ENCOUNTER — Emergency Department (HOSPITAL_COMMUNITY)
Admission: EM | Admit: 2019-01-25 | Discharge: 2019-01-25 | Discharge status: Against Medical Advice | Payer: BLUE CROSS/BLUE SHIELD | Attending: Emergency Medicine | Admitting: Emergency Medicine

## 2019-01-25 DIAGNOSIS — F17210 Nicotine dependence, cigarettes, uncomplicated: Secondary | ICD-10-CM | POA: Insufficient documentation

## 2019-01-25 DIAGNOSIS — F419 Anxiety disorder, unspecified: Secondary | ICD-10-CM | POA: Diagnosis not present

## 2019-01-25 DIAGNOSIS — I10 Essential (primary) hypertension: Secondary | ICD-10-CM | POA: Diagnosis not present

## 2019-01-25 DIAGNOSIS — R079 Chest pain, unspecified: Secondary | ICD-10-CM | POA: Diagnosis not present

## 2019-01-25 DIAGNOSIS — Z79899 Other long term (current) drug therapy: Secondary | ICD-10-CM | POA: Diagnosis not present

## 2019-01-25 LAB — I-STAT BETA HCG BLOOD, ED (NOT ORDERABLE): I-stat hCG, quantitative: <5 m[IU]/mL (ref <5)

## 2019-01-25 LAB — URINALYSIS, ROUTINE W REFLEX MICROSCOPIC
BILIRUBIN URINE: NEGATIVE
Glucose, UA: NEGATIVE mg/dL
Hgb urine dipstick: NEGATIVE
Ketones, ur: NEGATIVE mg/dL
Leukocytes,Ua: NEGATIVE
Nitrite: NEGATIVE
Protein, ur: NEGATIVE mg/dL
Specific Gravity, Urine: 1.019 (ref 1.005–1.030)
pH: 6 (ref 5.0–8.0)

## 2019-01-25 LAB — CBC
HCT: 46.5 % — ABNORMAL HIGH (ref 36.0–46.0)
HEMOGLOBIN: 15 g/dL (ref 12.0–15.0)
MCH: 30.4 pg (ref 26.0–34.0)
MCHC: 32.3 g/dL (ref 30.0–36.0)
MCV: 94.1 fL (ref 80.0–100.0)
Platelets: 343 10*3/uL (ref 150–400)
RBC: 4.94 MIL/uL (ref 3.87–5.11)
RDW: 13 % (ref 11.5–15.5)
WBC: 9.6 10*3/uL (ref 4.0–10.5)
nRBC: 0 % (ref 0.0–0.2)

## 2019-01-25 LAB — BASIC METABOLIC PANEL
Anion gap: 7 (ref 5–15)
BUN: 12 mg/dL (ref 6–20)
CO2: 26 mmol/L (ref 22–32)
Calcium: 9.5 mg/dL (ref 8.9–10.3)
Chloride: 106 mmol/L (ref 98–111)
Creatinine, Ser: 0.88 mg/dL (ref 0.44–1.00)
GFR calc Af Amer: >60 mL/min (ref >60)
GFR calc non Af Amer: >60 mL/min (ref >60)
Glucose, Bld: 105 mg/dL — ABNORMAL HIGH (ref 70–99)
Potassium: 3.5 mmol/L (ref 3.5–5.1)
Sodium: 139 mmol/L (ref 135–145)

## 2019-01-25 LAB — POCT I-STAT TROPONIN I: Troponin i, poc: 0 ng/mL (ref 0.00–0.08)

## 2019-01-25 MED ORDER — SODIUM CHLORIDE 0.9% FLUSH: 3.0000 mL | Freq: Once | INTRAVENOUS | Status: DC

## 2019-01-25 MED ORDER — ALBUTEROL SULFATE (2.5 MG/3ML) 0.083% IN NEBU
5.0000 mg | INHALATION_SOLUTION | Freq: Once | RESPIRATORY_TRACT | Status: DC
  Filled 2019-01-25: qty 6

## 2019-01-25 NOTE — ED Notes (Signed)
Pt stated she has to go home due to family emergency. MD aware. No iv. Stated she will follow up with PCP in the morning

## 2019-01-25 NOTE — ED Notes (Signed)
Pt ambulated to BR with no difficulty.  

## 2019-01-25 NOTE — ED Triage Notes (Signed)
Patient is complaining of sob and mid chest pain. Patient states that she has been feeling like this all day. Patient said the mid chest pain started an hour ago.

## 2019-01-25 NOTE — ED Provider Notes (Signed)
TIME SEEN: 11:09 PM  CHIEF COMPLAINT: Chest pain, shortness of breath  HPI: Patient is a 28 year old female with history of multiple sclerosis, panic attacks who presents to the emergency department with chest pain and shortness of breath.  States symptoms started today.  Describes the chest pain as an intermittent discomfort but is unable to describe it further.  Feels like her breathing is "shallow".  She states this felt different than her panic attacks.  States normally with her panic attack she feels palpitations but felt like her pulse was "barely there".  She was noted to be hypertensive here in the emergency department.  States she had normal spontaneous vaginal delivery with Dr. Carlynn Spry on January 9.  She is unable to tell me if she was diagnosed with gestational hypertension or preeclampsia but states that they plan to induce her at 38 weeks when she went into labor on her own.  States that she had protein in her urine her entire pregnancy.  States she was never on medication for her blood pressure.  She states that her vaginal bleeding has resolved.  No abdominal pain.  No history of PE or DVT.  No lower extremity swelling or pain.  ROS: See HPI Constitutional: no fever  Eyes: no drainage  ENT: no runny nose   Cardiovascular:   chest pain  Resp:  SOB  GI: no vomiting GU: no dysuria Integumentary: no rash  Allergy: no hives  Musculoskeletal: no leg swelling  Neurological: no slurred speech ROS otherwise negative  PAST MEDICAL HISTORY/PAST SURGICAL HISTORY:  Past Medical History:  Diagnosis Date  . CIN I (cervical intraepithelial neoplasia I) 2012  . Endometriosis   . Multiple sclerosis (HCC) 03/26/2018  . Panic   . Pap smear of cervix with ASCUS, cannot exclude HGSIL 2012    MEDICATIONS:  Prior to Admission medications   Medication Sig Start Date End Date Taking? Authorizing Provider  amphetamine-dextroamphetamine (ADDERALL) 20 MG tablet 1 tab by mouth in the AM, and 1/2 tab  in PM as needed 01/20/19   Corwin Levins, MD  Glatiramer Acetate (COPAXONE) 40 MG/ML SOSY 1 ml sq three times weekly 04/10/18   Corwin Levins, MD  predniSONE (DELTASONE) 10 MG tablet Begin taking 6 tablets daily, taper by one tablet every other day until off the medication. 05/19/18   York Spaniel, MD    ALLERGIES:  Allergies  Allergen Reactions  . Quetiapine Other (See Comments) and Nausea And Vomiting    Felt like she couldn't get out of bed, felt very weak Felt like she couldn't get out of bed, felt very weak Felt like she couldn't get out of bed, felt very weak Felt like she couldn't get out of bed, felt very weak    SOCIAL HISTORY:  Social History   Tobacco Use  . Smoking status: Current Every Day Smoker    Packs/day: 0.50    Types: Cigarettes  . Smokeless tobacco: Never Used  Substance Use Topics  . Alcohol use: Yes    Comment: occ    FAMILY HISTORY: Family History  Problem Relation Age of Onset  . Ovarian cancer Mother   . Skin cancer Mother   . Anxiety disorder Brother   . Prostate cancer Maternal Grandfather     EXAM: BP (!) 148/119 (BP Location: Left Arm)   Pulse (!) 102   Temp 97.7 F (36.5 C) (Oral)   Resp (!) 21   Ht 5\' 2"  (1.575 m)   Wt 63.5 kg  LMP  (LMP Unknown)   SpO2 100%   BMI 25.61 kg/m  CONSTITUTIONAL: Alert and oriented and responds appropriately to questions. Well-appearing; well-nourished HEAD: Normocephalic EYES: Conjunctivae clear, pupils appear equal, EOMI ENT: normal nose; moist mucous membranes NECK: Supple, no meningismus, no nuchal rigidity, no LAD  CARD: Giller and tachycardic; S1 and S2 appreciated; no murmurs, no clicks, no rubs, no gallops RESP: Normal chest excursion without splinting or tachypnea; breath sounds clear and equal bilaterally; no wheezes, no rhonchi, no rales, no hypoxia or respiratory distress, speaking full sentences ABD/GI: Normal bowel sounds; non-distended; soft, non-tender, no rebound, no guarding, no  peritoneal signs, no hepatosplenomegaly BACK:  The back appears normal and is non-tender to palpation, there is no CVA tenderness EXT: Normal ROM in all joints; non-tender to palpation; no edema; normal capillary refill; no cyanosis, no calf tenderness or swelling    SKIN: Normal color for age and race; warm; no rash NEURO: Moves all extremities equally PSYCH: The patient's mood and manner are appropriate. Grooming and personal hygiene are appropriate.  Denies SI or HI.  Reports anxiety.  MEDICAL DECISION MAKING: Patient here with complaints of chest pain and shortness of breath.  Does have history of panic attacks but is tachycardic and hypertensive here.  Just had a normal spontaneous vaginal delivery over 5 weeks ago.  Labs obtained in triage are unremarkable including negative troponin and clear chest x-ray.  No signs of volume overload to suggest postpartum cardiomyopathy.  Doubt ACS.  I am concerned for possible pulmonary embolus given she is tachycardic and postpartum.  Will obtain d-dimer.  EKG shows no ischemic abnormality.  Have offered her medication for anxiety which she declines.  She reports she is breast-feeding.  Will also obtain urinalysis given she is hypertensive here and monitor her blood pressure closely.  ED PROGRESS: Patient's urine has come back clean with no protein.  It does appear that she is on Adderall which could contribute to her tachycardia.  Unfortunately patient left the emergency department AGAINST MEDICAL ADVICE.  I was not aware that the patient was leaving the emergency department until she was already gone.  Nursing staff talked to patient about leaving without work-up complete.  Her d-dimer is pending.  1:35 AM  On review of her lab results, it appears her d-dimer came back negative.  I reviewed all nursing notes, vitals, pertinent previous records, EKGs, lab and urine results, imaging (as available).    EKG Interpretation  Date/Time:  Sunday January 25 2019  23:02:14 EST Ventricular Rate:  99 PR Interval:    QRS Duration: 97 QT Interval:  344 QTC Calculation: 442 R Axis:   66 Text Interpretation:  Sinus rhythm RSR' in V1 or V2, right VCD or RVH No significant change since last tracing Confirmed by Darvin Dials, Baxter Hire [phone]) on 01/25/2019 11:08:29 PM         Lukas Pelcher, Layla Maw, DO 01/26/19 0136

## 2019-01-26 LAB — D-DIMER, QUANTITATIVE (NOT AT ARMC): D DIMER QUANT: 0.34 ug{FEU}/mL (ref 0.00–0.50)

## 2019-02-02 DIAGNOSIS — Z130 Encounter for screening for diseases of the blood and blood-forming organs and certain disorders involving the immune mechanism: Secondary | ICD-10-CM | POA: Diagnosis not present

## 2019-02-03 DIAGNOSIS — G35 Multiple sclerosis: Secondary | ICD-10-CM | POA: Diagnosis not present

## 2019-02-09 DIAGNOSIS — L02415 Cutaneous abscess of right lower limb: Secondary | ICD-10-CM | POA: Diagnosis not present

## 2019-02-09 DIAGNOSIS — L089 Local infection of the skin and subcutaneous tissue, unspecified: Secondary | ICD-10-CM | POA: Diagnosis not present

## 2019-02-09 DIAGNOSIS — L729 Follicular cyst of the skin and subcutaneous tissue, unspecified: Secondary | ICD-10-CM | POA: Diagnosis not present

## 2019-02-15 ENCOUNTER — Emergency Department (HOSPITAL_COMMUNITY)
Admission: EM | Admit: 2019-02-15 | Discharge: 2019-02-16 | Discharge status: Against Medical Advice | Payer: BLUE CROSS/BLUE SHIELD | Attending: Emergency Medicine | Admitting: Emergency Medicine

## 2019-02-15 ENCOUNTER — Encounter (HOSPITAL_COMMUNITY): Payer: Self-pay

## 2019-02-15 ENCOUNTER — Other Ambulatory Visit: Payer: Self-pay

## 2019-02-15 DIAGNOSIS — Z5321 Procedure and treatment not carried out due to patient leaving prior to being seen by health care provider: Secondary | ICD-10-CM | POA: Diagnosis not present

## 2019-02-15 DIAGNOSIS — R0789 Other chest pain: Secondary | ICD-10-CM | POA: Diagnosis not present

## 2019-02-15 MED ORDER — SODIUM CHLORIDE 0.9% FLUSH: 3.0000 mL | Freq: Once | INTRAVENOUS | Status: DC

## 2019-02-15 NOTE — ED Triage Notes (Signed)
Pt states that around 930 she began having CP, palpations, SOB, and bilateral arm numbness which has now resolved, pt has a hx of MS and panic attacks, appears very anxious now

## 2019-02-16 LAB — CBC
HEMATOCRIT: 42.7 % (ref 36.0–46.0)
Hemoglobin: 13.8 g/dL (ref 12.0–15.0)
MCH: 29.1 pg (ref 26.0–34.0)
MCHC: 32.3 g/dL (ref 30.0–36.0)
MCV: 90.1 fL (ref 80.0–100.0)
Platelets: 413 10*3/uL — ABNORMAL HIGH (ref 150–400)
RBC: 4.74 MIL/uL (ref 3.87–5.11)
RDW: 13 % (ref 11.5–15.5)
WBC: 9 10*3/uL (ref 4.0–10.5)
nRBC: 0 % (ref 0.0–0.2)

## 2019-02-16 LAB — BASIC METABOLIC PANEL
Anion gap: 9 (ref 5–15)
BUN: 15 mg/dL (ref 6–20)
CHLORIDE: 105 mmol/L (ref 98–111)
CO2: 26 mmol/L (ref 22–32)
CREATININE: 0.77 mg/dL (ref 0.44–1.00)
Calcium: 9.5 mg/dL (ref 8.9–10.3)
GFR calc Af Amer: >60 mL/min (ref >60)
GFR calc non Af Amer: >60 mL/min (ref >60)
Glucose, Bld: 113 mg/dL — ABNORMAL HIGH (ref 70–99)
POTASSIUM: 3.8 mmol/L (ref 3.5–5.1)
SODIUM: 140 mmol/L (ref 135–145)

## 2019-02-16 LAB — I-STAT TROPONIN, ED: Troponin i, poc: 0.01 ng/mL (ref 0.00–0.08)

## 2019-02-16 LAB — I-STAT BETA HCG BLOOD, ED (MC, WL, AP ONLY): I-stat hCG, quantitative: <5 m[IU]/mL (ref <5)

## 2019-02-16 NOTE — ED Notes (Signed)
Unable to locate pt in lobby for room  

## 2019-02-19 ENCOUNTER — Other Ambulatory Visit: Payer: Self-pay | Admitting: Internal Medicine

## 2019-02-19 MED ORDER — AMPHETAMINE-DEXTROAMPHETAMINE 20 MG PO TABS: ORAL_TABLET | ORAL | 0 refills | Status: DC

## 2019-02-19 NOTE — Telephone Encounter (Signed)
Done erx 

## 2019-02-19 NOTE — Telephone Encounter (Unsigned)
Copied from CRM [id number]. Topic: Quick Communication - Rx Refill/Question >> Feb 19, 2019 12:47 PM Laural Benes, [place] C wrote: Medication: amphetamine-dextroamphetamine (ADDERALL) 20 MG tablet   Has the patient contacted their pharmacy? Yes - pt says that she was told to contact PCP instead   (Agent: If no, request that the patient contact the pharmacy for the refill.) (Agent: If yes, when and what did the pharmacy advise?)  Preferred Pharmacy (with phone number or street name): [phone] [place], [place] - [address] [phone] (Phone) [phone] (Fax)    Agent: Please be advised that RX refills may take up to 3 business days. We ask that you follow-up with your pharmacy.

## 2019-02-26 ENCOUNTER — Ambulatory Visit (INDEPENDENT_AMBULATORY_CARE_PROVIDER_SITE_OTHER): Payer: BLUE CROSS/BLUE SHIELD | Admitting: Internal Medicine

## 2019-02-26 ENCOUNTER — Other Ambulatory Visit: Payer: Self-pay

## 2019-02-26 ENCOUNTER — Encounter: Payer: Self-pay | Admitting: Internal Medicine

## 2019-02-26 VITALS — BP 128/88 | HR 120 | Temp 98.6°F | Ht 62.0 in | Wt 145.0 lb

## 2019-02-26 DIAGNOSIS — R739 Hyperglycemia, unspecified: Secondary | ICD-10-CM | POA: Diagnosis not present

## 2019-02-26 DIAGNOSIS — R079 Chest pain, unspecified: Secondary | ICD-10-CM | POA: Insufficient documentation

## 2019-02-26 DIAGNOSIS — F410 Panic disorder [episodic paroxysmal anxiety] without agoraphobia: Secondary | ICD-10-CM | POA: Insufficient documentation

## 2019-02-26 DIAGNOSIS — F418 Other specified anxiety disorders: Secondary | ICD-10-CM

## 2019-02-26 DIAGNOSIS — F909 Attention-deficit hyperactivity disorder, unspecified type: Secondary | ICD-10-CM | POA: Diagnosis not present

## 2019-02-26 DIAGNOSIS — Z0000 Encounter for general adult medical examination without abnormal findings: Secondary | ICD-10-CM | POA: Diagnosis not present

## 2019-02-26 DIAGNOSIS — R000 Tachycardia, unspecified: Secondary | ICD-10-CM | POA: Insufficient documentation

## 2019-02-26 LAB — POCT GLYCOSYLATED HEMOGLOBIN (HGB A1C): Hemoglobin A1C: 5.6 % (ref 4.0–5.6)

## 2019-02-26 MED ORDER — AMPHETAMINE-DEXTROAMPHETAMINE 20 MG PO TABS: ORAL_TABLET | ORAL | 0 refills | Status: DC

## 2019-02-26 MED ORDER — ALPRAZOLAM 1 MG PO TABS: ORAL_TABLET | ORAL | 0 refills | Status: DC

## 2019-02-26 NOTE — Assessment & Plan Note (Signed)

## 2019-02-26 NOTE — Assessment & Plan Note (Signed)
For limited xanax prn, . to f/u any worsening symptoms or concerns  

## 2019-02-26 NOTE — Assessment & Plan Note (Signed)
For a1c today  

## 2019-02-26 NOTE — Progress Notes (Addendum)
Subjective:    Patient ID: Gina Adams, female    DOB: 23-Nov-1991, 28 y.o.   MRN: [id number]  HPI  Here for wellness and f/u;  Overall doing ok;  Pt denies Chest pain, worsening SOB, DOE, wheezing, orthopnea, PND, worsening LE edema, palpitations, dizziness or syncope.  Pt denies neurological change such as new headache, facial or extremity weakness.  Pt denies polydipsia, polyuria, or low sugar symptoms. Pt states overall good compliance with treatment and medications, good tolerability, and has been trying to follow appropriate diet.  Pt denies worsening depressive symptoms, suicidal ideation or panic. No fever, night sweats, wt loss, loss of appetite, or other constitutional symptoms.  Pt states good ability with ADL's, has low fall risk, home safety reviewed and adequate, no other significant changes in hearing or vision, and only occasionally active with exercise.  Post purtum since jan 9 and baby husband doing quite well.  Starting MS infusion next mo per [facility] neurology - [place] (had been on prior  Copaxin).  Feeling more stress recently for unclear reason (not depressed).  Now in school fulltime with new child, but husband supports the family Past Medical History:  Diagnosis Date  . CIN I (cervical intraepithelial neoplasia I) 2012  . Endometriosis   . Multiple sclerosis (HCC) 03/26/2018  . Panic   . Pap smear of cervix with ASCUS, cannot exclude HGSIL 2012   Past Surgical History:  Procedure Laterality Date  . DIAGNOSTIC LAPAROSCOPY    . DILATION AND EVACUATION N/A 08/08/2015   Procedure: DILATATION AND EVACUATION;  Surgeon: Zelphia Cairo, MD;  Location: [facility];  Service: Gynecology;  Laterality: N/A;  . TONSILLECTOMY    . WISDOM TOOTH EXTRACTION     x4    reports that she has been smoking cigarettes. She has been smoking about 0.50 packs per day. She has never used smokeless tobacco. She reports current alcohol use. She reports that she does not use drugs. family history  includes Anxiety disorder in her brother; Ovarian cancer in her mother; Prostate cancer in her maternal grandfather; Skin cancer in her mother. Allergies  Allergen Reactions  . Quetiapine Other (See Comments) and Nausea And Vomiting    Felt like she couldn't get out of bed, felt very weak Felt like she couldn't get out of bed, felt very weak Felt like she couldn't get out of bed, felt very weak Felt like she couldn't get out of bed, felt very weak   Current Outpatient Medications on File Prior to Visit  Medication Sig Dispense Refill  . Glatiramer Acetate (COPAXONE) 40 MG/ML SOSY 1 ml sq three times weekly 12 mL 11  . predniSONE (DELTASONE) 10 MG tablet Begin taking 6 tablets daily, taper by one tablet every other day until off the medication. 21 tablet 0   No current facility-administered medications on file prior to visit.    Review of Systems Constitutional: Negative for other unusual diaphoresis, sweats, appetite or weight changes HENT: Negative for other worsening hearing loss, ear pain, facial swelling, mouth sores or neck stiffness.   Eyes: Negative for other worsening pain, redness or other visual disturbance.  Respiratory: Negative for other stridor or swelling Cardiovascular: Negative for other palpitations or other chest pain  Gastrointestinal: Negative for worsening diarrhea or loose stools, blood in stool, distention or other pain Genitourinary: Negative for hematuria, flank pain or other change in urine volume.  Musculoskeletal: Negative for myalgias or other joint swelling.  Skin: Negative for other color change, or other wound  or worsening drainage.  Neurological: Negative for other syncope or numbness. Hematological: Negative for other adenopathy or swelling Psychiatric/Behavioral: Negative for hallucinations, other worsening agitation, SI, self-injury, or new decreased concentration All other system neg per pt    Objective:   Physical Exam BP 128/88   Pulse (!) 120    Temp 98.6 F (37 C) (Oral)   Ht 5\' 2"  (1.575 m)   Wt 145 lb (65.8 kg)   SpO2 99%   BMI 26.52 kg/m  VS noted, mild overwt Constitutional: Pt is oriented to person, place, and time. Appears well-developed and well-nourished, in no significant distress and comfortable Head: Normocephalic and atraumatic  Eyes: Conjunctivae and EOM are normal. Pupils are equal, round, and reactive to light Right Ear: External ear normal without discharge Left Ear: External ear normal without discharge Nose: Nose without discharge or deformity Mouth/Throat: Oropharynx is without other ulcerations and moist  Neck: Normal range of motion. Neck supple. No JVD present. No tracheal deviation present or significant neck LA or mass Cardiovascular: Normal rate, regular rhythm, normal heart sounds and intact distal pulses.   Pulmonary/Chest: WOB normal and breath sounds without rales or wheezing  Abdominal: Soft. Bowel sounds are normal. NT. No HSM  Musculoskeletal: Normal range of motion. Exhibits no edema Lymphadenopathy: Has no other cervical adenopathy.  Neurological: Pt is alert and oriented to person, place, and time. Pt has normal reflexes. No cranial nerve deficit. Motor grossly intact, Gait intact Skin: Skin is warm and dry. No rash noted or new ulcerations Psychiatric:  Has normal mood and affect. Behavior is normal without agitation No other exam findings  POCT glycosylated hemoglobin (Hb A1C)  Order: [id number]  Status:  Final result Visible to patient:  No (Not Released) Dx:  Hyperglycemia   Ref Range & Units 14:24  Hemoglobin A1C 4.0 - 5.6 % 5.6           Assessment & Plan:

## 2019-02-26 NOTE — Assessment & Plan Note (Signed)
Pt ok with referral for counseling

## 2019-02-26 NOTE — Patient Instructions (Signed)
Please take all new medication as prescribed  - the xanax as needed for only very infrequent panic attacks  You will be contacted regarding the referral for: Counseling  Your A1c was OK today  OK to increase the adderall to 20 mg twice per day  Please continue all other medications as before, and refills have been done if requested.  Please have the pharmacy call with any other refills you may need.  Please continue your efforts at being more active, low cholesterol diet, and weight control.  You are otherwise up to date with prevention measures today.  Please keep your appointments with your specialists as you may have planned  Please return in 1 year for your yearly visit, or sooner if needed

## 2019-02-26 NOTE — Assessment & Plan Note (Signed)
stable overall by history and exam, recent data reviewed with pt, and pt to continue medical treatment as before,  to f/u any worsening symptoms or concerns  

## 2019-03-15 ENCOUNTER — Emergency Department (HOSPITAL_COMMUNITY)
Admission: EM | Admit: 2019-03-15 | Discharge: 2019-03-15 | Disposition: A | Discharge status: Home / Self Care | Payer: BLUE CROSS/BLUE SHIELD | Attending: Emergency Medicine | Admitting: Emergency Medicine

## 2019-03-15 ENCOUNTER — Encounter (HOSPITAL_COMMUNITY): Payer: Self-pay | Admitting: *Deleted

## 2019-03-15 ENCOUNTER — Other Ambulatory Visit: Payer: Self-pay

## 2019-03-15 ENCOUNTER — Emergency Department (HOSPITAL_COMMUNITY): Payer: BLUE CROSS/BLUE SHIELD

## 2019-03-15 DIAGNOSIS — F17210 Nicotine dependence, cigarettes, uncomplicated: Secondary | ICD-10-CM | POA: Insufficient documentation

## 2019-03-15 DIAGNOSIS — Z79899 Other long term (current) drug therapy: Secondary | ICD-10-CM | POA: Diagnosis not present

## 2019-03-15 DIAGNOSIS — R000 Tachycardia, unspecified: Secondary | ICD-10-CM

## 2019-03-15 DIAGNOSIS — R079 Chest pain, unspecified: Secondary | ICD-10-CM | POA: Diagnosis not present

## 2019-03-15 DIAGNOSIS — R05 Cough: Secondary | ICD-10-CM | POA: Diagnosis not present

## 2019-03-15 DIAGNOSIS — J069 Acute upper respiratory infection, unspecified: Secondary | ICD-10-CM | POA: Insufficient documentation

## 2019-03-15 DIAGNOSIS — R0602 Shortness of breath: Secondary | ICD-10-CM | POA: Diagnosis not present

## 2019-03-15 DIAGNOSIS — R072 Precordial pain: Secondary | ICD-10-CM | POA: Insufficient documentation

## 2019-03-15 DIAGNOSIS — Z720 Tobacco use: Secondary | ICD-10-CM

## 2019-03-15 LAB — BASIC METABOLIC PANEL
Anion gap: 8 (ref 5–15)
BUN: 18 mg/dL (ref 6–20)
CO2: 25 mmol/L (ref 22–32)
Calcium: 9.3 mg/dL (ref 8.9–10.3)
Chloride: 104 mmol/L (ref 98–111)
Creatinine, Ser: 0.58 mg/dL (ref 0.44–1.00)
GFR calc Af Amer: >60 mL/min (ref >60)
GFR calc non Af Amer: >60 mL/min (ref >60)
Glucose, Bld: 120 mg/dL — ABNORMAL HIGH (ref 70–99)
Potassium: 4.4 mmol/L (ref 3.5–5.1)
Sodium: 137 mmol/L (ref 135–145)

## 2019-03-15 LAB — CBC WITH DIFFERENTIAL/PLATELET
Abs Immature Granulocytes: 0.05 10*3/uL (ref 0.00–0.07)
Basophils Absolute: 0 10*3/uL (ref 0.0–0.1)
Basophils Relative: 0 %
Eosinophils Absolute: 0 10*3/uL (ref 0.0–0.5)
Eosinophils Relative: 0 %
HCT: 40.2 % (ref 36.0–46.0)
Hemoglobin: 13 g/dL (ref 12.0–15.0)
Immature Granulocytes: 1 %
Lymphocytes Relative: 12 %
Lymphs Abs: 1 10*3/uL (ref 0.7–4.0)
MCH: 29.7 pg (ref 26.0–34.0)
MCHC: 32.3 g/dL (ref 30.0–36.0)
MCV: 92 fL (ref 80.0–100.0)
Monocytes Absolute: 0.1 10*3/uL (ref 0.1–1.0)
Monocytes Relative: 1 %
Neutro Abs: 7 10*3/uL (ref 1.7–7.7)
Neutrophils Relative %: 86 %
Platelets: 363 10*3/uL (ref 150–400)
RBC: 4.37 MIL/uL (ref 3.87–5.11)
RDW: 13.8 % (ref 11.5–15.5)
WBC: 8.1 10*3/uL (ref 4.0–10.5)
nRBC: 0 % (ref 0.0–0.2)

## 2019-03-15 LAB — I-STAT BETA HCG BLOOD, ED (MC, WL, AP ONLY): I-stat hCG, quantitative: <5 m[IU]/mL (ref <5)

## 2019-03-15 LAB — I-STAT TROPONIN, ED: Troponin i, poc: 0 ng/mL (ref 0.00–0.08)

## 2019-03-15 LAB — D-DIMER, QUANTITATIVE: D-Dimer, Quant: <0.27 ug/mL-FEU (ref 0.00–0.50)

## 2019-03-15 MED ORDER — SODIUM CHLORIDE 0.9 % IV BOLUS
1000.0000 mL | Freq: Once | INTRAVENOUS | Status: AC
  Administered 2019-03-15: 1000 mL via INTRAVENOUS

## 2019-03-15 MED ORDER — LORAZEPAM 2 MG/ML IJ SOLN
1.0000 mg | Freq: Once | INTRAMUSCULAR | Status: AC
  Administered 2019-03-15: 1 mg via INTRAVENOUS
  Filled 2019-03-15: qty 1

## 2019-03-15 NOTE — ED Notes (Signed)
Bed: WA12 Expected date:  Expected time:  Means of arrival:  Comments: 

## 2019-03-15 NOTE — ED Provider Notes (Signed)
[facility]-EMERGENCY DEPT Provider Note   CSN: [id number] Arrival date & time: 03/15/19  1243    History   Chief Complaint Chief Complaint  Patient presents with  . Shortness of Breath  . Chest Pain    HPI    Gina Adams is a 28 y.o. female with a PMHx of MS, sinus tachycardia, panic attacks, ADD, anxiety, and other conditions listed below, who presents to the ED with multitude of complaints.  Patient states that about 2 weeks ago she started "not feeling well".  Her symptoms included rhinorrhea, fatigue, body aches, watery eyes, a cough which is now dry and nonproductive, intermittent chest tightness and soreness, intermittent shortness of breath, headaches, intermittent sore throat, very mild nausea, and intermittent palpitations.  She has been taking Sudafed with some relief.  Her doctor prescribed her Xanax thinking that there was an anxiety component, and initially that was helping however it has stopped helping.  No known aggravating factors.  She started thinking that she was having an MS flare on Friday (2 days ago), thinking that her fatigue and body aches were from that, so she was started on steroids at that time (called her neurologist who called in a prednisone taper, starting with  x2 days then tapering).  She reports that last night her chest tightness and soreness and shortness of breath seem to be worse, so her doctor instructed her to come to the ED today.  She endorses drinking caffeine daily.  She endorses being a cigarette smoker.  She has 2 children (17mos and 2.45mos) who have also had some occasional coughing and have been fussier than normal lately so she believes they could be sick.  No recent travel and no known exposure to COVID-19.  She denies any family history of cardiac disease.    She denies any fevers, chills, ear pain or drainage, leg swelling, recent travel/surgery/immobilization, estrogen use, personal or family history of DVT/PE,  abdominal pain, vomiting, diarrhea, constipation, dysuria, hematuria, numbness, tingling, focal weakness, claudication, orthopnea, diaphoresis, lightheadedness, or any other complaints at this time.  The history is provided by the patient and medical records. No language interpreter was used.  Shortness of Breath  Associated symptoms: chest pain, cough, headaches and sore throat (intermittent)   Associated symptoms: no abdominal pain, no diaphoresis, no ear pain, no fever and no vomiting   Chest Pain  Associated symptoms: cough, fatigue, headache, nausea (very mild), palpitations (intermittent) and shortness of breath   Associated symptoms: no abdominal pain, no diaphoresis, no fever, no numbness, no vomiting and no weakness     Past Medical History:  Diagnosis Date  . CIN I (cervical intraepithelial neoplasia I) 2012  . Endometriosis   . Multiple sclerosis (HCC) 03/26/2018  . Panic   . Pap smear of cervix with ASCUS, cannot exclude HGSIL 2012    Patient Active Problem List   Diagnosis Date Noted  . Sinus tachycardia 02/26/2019  . Chest pain 02/26/2019  . Panic attacks 02/26/2019  . Hyperglycemia 02/26/2019  . ADD (attention deficit disorder) 04/10/2018  . Multiple sclerosis (HCC) 03/26/2018  . Preventative health care 03/15/2018  . Anxiety with depression 03/14/2018  . Paresthesias 03/14/2018  . Smoker 12/21/2016  . Personal history of endometriosis 12/21/2016  . Female pelvic pain 12/21/2016    Past Surgical History:  Procedure Laterality Date  . DIAGNOSTIC LAPAROSCOPY    . DILATION AND EVACUATION N/A 08/08/2015   Procedure: DILATATION AND EVACUATION;  Surgeon: Zelphia Cairo, MD;  Location:  [facility];  Service: Gynecology;  Laterality: N/A;  . TONSILLECTOMY    . WISDOM TOOTH EXTRACTION     x4     OB History    Gravida  2   Para  0   Term  0   Preterm  0   AB  1   Living  0     SAB  1   TAB  0   Ectopic  0   Multiple  0   Live Births                Home Medications    Prior to Admission medications   Medication Sig Start Date End Date Taking? Authorizing Provider  ALPRAZolam Prudy Feeler) 1 MG tablet 1/2 - 1 tab by mouth as needed for panic 02/26/19   Corwin Levins, MD  amphetamine-dextroamphetamine (ADDERALL) 20 MG tablet 1 tab by mouth twice per day 02/26/19   Corwin Levins, MD  Glatiramer Acetate (COPAXONE) 40 MG/ML SOSY 1 ml sq three times weekly 04/10/18   Corwin Levins, MD  predniSONE (DELTASONE) 10 MG tablet Begin taking 6 tablets daily, taper by one tablet every other day until off the medication. 05/19/18   York Spaniel, MD    Family History Family History  Problem Relation Age of Onset  . Ovarian cancer Mother   . Skin cancer Mother   . Anxiety disorder Brother   . Prostate cancer Maternal Grandfather     Social History Social History   Tobacco Use  . Smoking status: Current Every Day Smoker    Packs/day: 0.50    Types: Cigarettes  . Smokeless tobacco: Never Used  Substance Use Topics  . Alcohol use: Yes    Comment: occ  . Drug use: No    Comment: denies 09/23/2015 marijuana when pt was 15     Allergies   Quetiapine   Review of Systems Review of Systems  Constitutional: Positive for fatigue. Negative for chills, diaphoresis and fever.  HENT: Positive for rhinorrhea and sore throat (intermittent). Negative for ear discharge and ear pain.   Eyes: Positive for discharge (watery).  Respiratory: Positive for cough, chest tightness and shortness of breath.   Cardiovascular: Positive for chest pain and palpitations (intermittent). Negative for leg swelling.  Gastrointestinal: Positive for nausea (very mild). Negative for abdominal pain, constipation, diarrhea and vomiting.  Genitourinary: Negative for dysuria and hematuria.  Musculoskeletal: Positive for myalgias. Negative for arthralgias.  Skin: Negative for color change.  Allergic/Immunologic: Positive for immunocompromised state (MS on steroids).   Neurological: Positive for headaches. Negative for weakness, light-headedness and numbness.  Psychiatric/Behavioral: Negative for confusion.   All other systems reviewed and are negative for acute change except as noted in the HPI.    Physical Exam Updated Vital Signs BP (!) 142/92 (BP Location: Right Arm)   Pulse (!) 107   Temp 98.5 F (36.9 C) (Oral)   Resp 19   Ht  (1.575 m)   Wt 64 kg   SpO2 99%   BMI 25.79 kg/m    Physical Exam Vitals signs and nursing note reviewed.  Constitutional:      General: She is not in acute distress.    Appearance: Normal appearance. She is well-developed. She is not toxic-appearing.     Comments: Afebrile, nontoxic, NAD, slightly anxious appearing  HENT:     Head: Normocephalic and atraumatic.     Nose: Nose normal.     Mouth/Throat:     Mouth:  Mucous membranes are moist.     Pharynx: Oropharynx is clear. Uvula midline. No pharyngeal swelling, oropharyngeal exudate, posterior oropharyngeal erythema or uvula swelling.     Tonsils: No tonsillar exudate or tonsillar abscesses.     Comments: Nose clear. Oropharynx clear and moist, without uvular swelling or deviation, no trismus or drooling, no tonsillar swelling or erythema, no exudates.   Eyes:     General:        Right eye: No discharge.        Left eye: No discharge.     Conjunctiva/sclera: Conjunctivae normal.  Neck:     Musculoskeletal: Normal range of motion and neck supple.  Cardiovascular:     Rate and Rhythm: Regular rhythm. Tachycardia present.     Pulses: Normal pulses.     Heart sounds: Normal heart sounds, S1 normal and S2 normal. No murmur. No friction rub. No gallop.      Comments: Mildly tachycardic in the 100-110s during exam similar to prior visits, reg rhythm, nl s1/s2, no m/r/g, distal pulses intact, no pedal edema  Pulmonary:     Effort: Pulmonary effort is normal. No respiratory distress.     Breath sounds: Normal breath sounds. No decreased breath sounds,  wheezing, rhonchi or rales.     Comments: CTAB in all lung fields, no w/r/r, no hypoxia or increased WOB, speaking in full sentences, SpO2 99% on RA  Chest:     Chest wall: Tenderness present. No deformity or crepitus.       Comments: Chest wall with mild TTP to anterior and slightly left anterior chest wall, without crepitus, deformities, or retractions  Abdominal:     General: Bowel sounds are normal. There is no distension.     Palpations: Abdomen is soft. Abdomen is not rigid.     Tenderness: There is no abdominal tenderness. There is no right CVA tenderness, left CVA tenderness, guarding or rebound. Negative signs include Murphy's sign and McBurney's sign.  Musculoskeletal: Normal range of motion.  Skin:    General: Skin is warm and dry.     Findings: No rash.  Neurological:     Mental Status: She is alert and oriented to person, place, and time.     Sensory: Sensation is intact. No sensory deficit.     Motor: Motor function is intact.  Psychiatric:        Mood and Affect: Affect normal. Mood is anxious.        Behavior: Behavior normal.     Comments: Slightly anxious appearing      ED Treatments / Results  Labs (all labs ordered are listed, but only abnormal results are displayed) Labs Reviewed  BASIC METABOLIC PANEL - Abnormal; Notable for the following components:      Result Value   Glucose, Bld 120 (*)    All other components within normal limits  CBC WITH DIFFERENTIAL/PLATELET  D-DIMER, QUANTITATIVE (NOT AT [facility])  I-STAT TROPONIN, ED  I-STAT BETA HCG BLOOD, ED ([facility], [facility], [facility] ONLY)    EKG EKG Interpretation  Date/Time:  Sunday March 15 2019 12:52:21 EDT Ventricular Rate:  132 PR Interval:    QRS Duration: 84 QT Interval:  294 QTC Calculation: 436 R Axis:   57 Text Interpretation:  Sinus tachycardia RSR' in V1 or V2, probably normal variant Borderline repolarization abnormality Baseline wander in lead(s) II III aVF V3 V5 no change from previous Confirmed by  Arby Barrette [phone]) on 03/15/2019 2:11:39 PM Also confirmed by Bryan Lemma ([id number])  on 03/15/2019 2:18:07 PM   Radiology Dg Chest Port 1 View  Result Date: 03/15/2019 CLINICAL DATA:  Cough, shortness of breath, and chest pain. Tachycardia. On steroid treatment for multiple sclerosis. EXAM: PORTABLE CHEST 1 VIEW COMPARISON:  01/25/2019 FINDINGS: The heart size and mediastinal contours are within normal limits. Both lungs are clear. The visualized skeletal structures are unremarkable. IMPRESSION: No active disease. Electronically Signed   By: Myles Rosenthal M.D.   On: 03/15/2019 14:28    Procedures Procedures (including critical care time)  Medications Ordered in ED Medications  sodium chloride 0.9 % bolus 1,000 mL (1,000 mLs Intravenous Bolus 03/15/19 1423)  LORazepam (ATIVAN) injection 1 mg (1 mg Intravenous Given 03/15/19 1421)     Initial Impression / Assessment and Plan / ED Course  I have reviewed the triage vital signs and the nursing notes.  Pertinent labs & imaging results that were available during my care of the patient were reviewed by me and considered in my medical decision making (see chart for details).        28 y.o. female here with a multitude of complaints, primarily URI symptoms for about 2 weeks with intermittent chest tightness and soreness as well as shortness of breath.  The chest tightness/soreness and shortness of breath have worsened since yesterday.  On exam, mildly tachycardic in the 100-110s, appears mildly anxious, nose clear, throat clear, lung sounds clear, no hypoxia or increased work of breathing, no pedal edema, negative Homans sign, mild anterior chest wall TTP.  It is likely that allergies versus viral URI are contributing to most of her symptoms, however given her tachycardia and complaints of chest pain and shortness of breath, PE is of course on the differential although felt to be less likely.  We will proceed with labs with d-dimer, chest x-ray, give  fluids and Ativan, and reassess shortly. Doubt need for flu testing/RVP/Covid 19 testing.   QUIANNA AVERY was evaluated in Emergency Department on 03/15/2019 for the symptoms described in the history of present illness. She was evaluated in the context of the global COVID-19 pandemic, which necessitated consideration that the patient might be at risk for infection with the SARS-CoV-2 virus that causes COVID-19. Institutional protocols and algorithms that pertain to the evaluation of patients at risk for COVID-19 are in a state of rapid change based on information released by regulatory bodies including the CDC and federal and state organizations. These policies and algorithms were followed during the patient's care in the ED.  3:54 PM CBC w/diff WNL. BMP WNL. Trop neg. D-dimer negative therefore doubt PE as a possibility. BetaHCG neg. CXR negative for acute findings. EKG with sinus tachycardia but no acute ischemic findings. Pt feeling better, tachycardia improving. Overall, suspect some component of anxiety causing some of her symptoms, as well as caffeine/sudafed use contributing to tachycardia, as well as possible allergic component for her URI symptoms vs viral although less likely. Doubt ACS, doubt need for repeat troponin.  Advised use of OTC remedies for symptomatic relief, avoidance of caffeine and sudafed, smoking cessation strongly encouraged, and f/up with PCP in 5-7 days for recheck. Doubt need for self-quarantining since she's already two weeks out and has been afebrile. I explained the diagnosis and have given explicit precautions to return to the ER including for any other new or worsening symptoms. The patient understands and accepts the medical plan as it's been dictated and I have answered their questions. Discharge instructions concerning home care and prescriptions have been given. The  patient is STABLE and is discharged to home in good condition.     Final Clinical Impressions(s) / ED  Diagnoses   Final diagnoses:  Precordial chest pain  Upper respiratory tract infection, unspecified type  SOB (shortness of breath)  Tachycardia  Tobacco user    ED Discharge Orders    [address] [place], [place] 03/15/19 1555    Arby Barrette, MD 03/18/19 [phone]

## 2019-03-15 NOTE — ED Notes (Signed)
Bed: WTR6 Expected date:  Expected time:  Means of arrival:  Comments: 

## 2019-03-15 NOTE — ED Triage Notes (Signed)
Pt has MS been on steroids, intermittent  Chest pain. HR 142. Feeling hot with no fever, lungs clear.

## 2019-03-15 NOTE — ED Notes (Signed)
X-ray at bedside

## 2019-03-15 NOTE — Discharge Instructions (Addendum)
Your evaluation today was reassuring. Your symptoms could possibly be related to allergies, you can use over the counter antihistamines to help with symptoms. Stay well hydrated. You may use your home xanax as needed for anxiety. Avoid caffeine and sudafed as this can make your heart rate go up. STOP SMOKING! Alternate between tylenol and ibuprofen as needed for pain. Follow up with your regular doctor in 5-7 days for recheck of symptoms. Return to the ER for emergent changes or worsening symptoms.

## 2019-03-24 ENCOUNTER — Encounter: Payer: BLUE CROSS/BLUE SHIELD | Admitting: Internal Medicine

## 2019-03-25 DIAGNOSIS — Z79899 Other long term (current) drug therapy: Secondary | ICD-10-CM | POA: Diagnosis not present

## 2019-03-25 DIAGNOSIS — G35 Multiple sclerosis: Secondary | ICD-10-CM | POA: Diagnosis not present

## 2019-03-30 ENCOUNTER — Other Ambulatory Visit: Payer: Self-pay | Admitting: Internal Medicine

## 2019-03-30 MED ORDER — ALPRAZOLAM 1 MG PO TABS: ORAL_TABLET | ORAL | 1 refills | Status: DC

## 2019-03-30 NOTE — Telephone Encounter (Signed)
Done erx 

## 2019-04-10 DIAGNOSIS — G35 Multiple sclerosis: Secondary | ICD-10-CM | POA: Diagnosis not present

## 2019-04-10 DIAGNOSIS — Z79899 Other long term (current) drug therapy: Secondary | ICD-10-CM | POA: Diagnosis not present

## 2019-04-15 ENCOUNTER — Other Ambulatory Visit: Payer: Self-pay | Admitting: Internal Medicine

## 2019-04-16 MED ORDER — AMPHETAMINE-DEXTROAMPHETAMINE 20 MG PO TABS: ORAL_TABLET | ORAL | 0 refills | Status: DC

## 2019-04-16 NOTE — Telephone Encounter (Signed)
Done erx 

## 2019-05-02 ENCOUNTER — Ambulatory Visit (INDEPENDENT_AMBULATORY_CARE_PROVIDER_SITE_OTHER): Payer: BLUE CROSS/BLUE SHIELD | Admitting: Family Medicine

## 2019-05-02 ENCOUNTER — Other Ambulatory Visit: Payer: Self-pay

## 2019-05-02 ENCOUNTER — Encounter: Payer: Self-pay | Admitting: Family Medicine

## 2019-05-02 VITALS — Ht 62.0 in | Wt 141.0 lb

## 2019-05-02 DIAGNOSIS — J302 Other seasonal allergic rhinitis: Secondary | ICD-10-CM | POA: Diagnosis not present

## 2019-05-02 DIAGNOSIS — J029 Acute pharyngitis, unspecified: Secondary | ICD-10-CM

## 2019-05-02 MED ORDER — AZELASTINE HCL 0.1 % NA SOLN: 2.0000 | Freq: Two times a day (BID) | NASAL | 12 refills | Status: DC

## 2019-05-02 NOTE — Progress Notes (Signed)
   Chief Complaint:  Gina Adams is a 28 y.o. female who presents today for a virtual office visit with a chief complaint of Sore throat.   Assessment/Plan:  Sore Throat / Seasonal Allergies No red flags.  No signs of bacterial or viral infection.  She has tried and failed over-the-counter antihistamines.  Will start Astelin nasal spray.  If no improvement, would consider course of Singulair.  Discussed reasons to return to care.  Follow-up as needed.    Subjective:  HPI:  Sore Throat Started about a month or two ago. Feels like seasonal allergies. Has tried several OTC medications with no improvement.  Symptoms are intermittent in nature.  Not constant.  Area is not painful, just irritated.  No fevers or chills.  She has had some head pressure.  No rhinorrhea.  No dizziness.  She tried using Flonase for a few days however seem to make things worse.  No cough.  No known sick contacts.  ROS: Per HPI  PMH: She reports that she has been smoking cigarettes. She has been smoking about 0.50 packs per day. She has never used smokeless tobacco. She reports current alcohol use. She reports that she does not use drugs.      Objective/Observations  Physical Exam: Gen: NAD, resting comfortably Pulm: Normal work of breathing Neuro: Grossly normal, moves all extremities Psych: Normal affect and thought content  Virtual Visit via Video   I connected with Gina Adams on 05/02/19 at  9:00 AM EDT by a video enabled telemedicine application and verified that I am speaking with the correct person using two identifiers. I discussed the limitations of evaluation and management by telemedicine and the availability of in person appointments. The patient expressed understanding and agreed to proceed.   Patient location: Home Provider location: [place] [employer] Persons participating in the virtual visit: Myself and Patient     [name]. Jimmey Ralph, MD 05/02/2019 9:09 AM

## 2019-05-13 ENCOUNTER — Other Ambulatory Visit: Payer: Self-pay | Admitting: Internal Medicine

## 2019-05-13 MED ORDER — AMPHETAMINE-DEXTROAMPHETAMINE 20 MG PO TABS: ORAL_TABLET | ORAL | 0 refills | Status: DC

## 2019-05-13 NOTE — Telephone Encounter (Signed)
Done erx 

## 2019-05-17 DIAGNOSIS — Z20828 Contact with and (suspected) exposure to other viral communicable diseases: Secondary | ICD-10-CM | POA: Diagnosis not present

## 2019-05-26 ENCOUNTER — Other Ambulatory Visit: Payer: Self-pay | Admitting: Internal Medicine

## 2019-05-27 NOTE — Telephone Encounter (Signed)
Done erx 

## 2019-06-11 ENCOUNTER — Telehealth: Payer: Self-pay | Admitting: Internal Medicine

## 2019-06-11 MED ORDER — AMPHETAMINE-DEXTROAMPHETAMINE 20 MG PO TABS: ORAL_TABLET | ORAL | 0 refills | Status: DC

## 2019-06-11 NOTE — Telephone Encounter (Signed)
Medication Refill - Medication: amphetamine-dextroamphetamine (ADDERALL) 20 MG tablet   Has the patient contacted their pharmacy? No.Pt states she will be out of the medication by Saturday. Please advise.  (Agent: If no, request that the patient contact the pharmacy for the refill.) (Agent: If yes, when and what did the pharmacy advise?)  Preferred Pharmacy (with phone number or street name):  [id number] - [place], [address] AT [address] [place] [address]  Phone: [phone] Fax: [phone]  Not a 24 hour pharmacy; exact hours not known.     Agent: Please be advised that RX refills may take up to 3 business days. We ask that you follow-up with your pharmacy.

## 2019-06-11 NOTE — Telephone Encounter (Signed)
Done erx 

## 2019-06-12 ENCOUNTER — Other Ambulatory Visit: Payer: Self-pay | Admitting: Internal Medicine

## 2019-06-15 NOTE — Telephone Encounter (Signed)
adderall refill too soone, just done July 2

## 2019-06-23 DIAGNOSIS — G35 Multiple sclerosis: Secondary | ICD-10-CM | POA: Diagnosis not present

## 2019-06-23 DIAGNOSIS — H5213 Myopia, bilateral: Secondary | ICD-10-CM | POA: Diagnosis not present

## 2019-06-23 DIAGNOSIS — H40013 Open angle with borderline findings, low risk, bilateral: Secondary | ICD-10-CM | POA: Diagnosis not present

## 2019-07-12 ENCOUNTER — Other Ambulatory Visit: Payer: Self-pay | Admitting: Internal Medicine

## 2019-07-13 MED ORDER — AMPHETAMINE-DEXTROAMPHETAMINE 20 MG PO TABS: ORAL_TABLET | ORAL | 0 refills | Status: DC

## 2019-07-13 NOTE — Telephone Encounter (Signed)
Done erx 

## 2019-07-22 DIAGNOSIS — M792 Neuralgia and neuritis, unspecified: Secondary | ICD-10-CM | POA: Diagnosis not present

## 2019-07-22 DIAGNOSIS — G35 Multiple sclerosis: Secondary | ICD-10-CM | POA: Diagnosis not present

## 2019-07-22 DIAGNOSIS — F419 Anxiety disorder, unspecified: Secondary | ICD-10-CM | POA: Diagnosis not present

## 2019-07-23 ENCOUNTER — Telehealth: Payer: Self-pay | Admitting: Internal Medicine

## 2019-07-23 MED ORDER — ALPRAZOLAM 1 MG PO TABS: ORAL_TABLET | ORAL | 2 refills | Status: DC

## 2019-07-23 NOTE — Telephone Encounter (Signed)
Done erx 

## 2019-07-23 NOTE — Telephone Encounter (Signed)
Pt says she is sensitive to an inactive ingredient in her alprazolam.  She is asking that the med be sent into [facility].  Pt says that she has checked pharmacies, and this one does not use the same manufacturer, so does not have the inactive ingredient she is sensitive to.  This has happened with some of her other medications.

## 2019-07-23 NOTE — Addendum Note (Signed)
Addended by: Biagio Borg on: 07/23/2019 12:27 PM   Modules accepted: Orders

## 2019-08-05 ENCOUNTER — Telehealth: Payer: BLUE CROSS/BLUE SHIELD | Admitting: Family Medicine

## 2019-08-10 ENCOUNTER — Telehealth: Payer: Self-pay | Admitting: Internal Medicine

## 2019-08-10 NOTE — Telephone Encounter (Signed)
Medication Refill - Medication: amphetamine-dextroamphetamine (ADDERALL) 20 MG tablet  Has the patient contacted their pharmacy? no (Agent: If no, request that the patient contact the pharmacy for the refill.) (Agent: If yes, when and what did the pharmacy advise?)  Preferred Pharmacy (with phone number or street name):  [place], [place] - [address] [phone] (Phone) [phone] (Fax)   Agent: Please be advised that RX refills may take up to 3 business days. We ask that you follow-up with your pharmacy.

## 2019-08-11 MED ORDER — AMPHETAMINE-DEXTROAMPHETAMINE 20 MG PO TABS: ORAL_TABLET | ORAL | 0 refills | Status: DC

## 2019-08-11 NOTE — Telephone Encounter (Signed)
Done erx 

## 2019-08-11 NOTE — Addendum Note (Signed)
Addended by: Biagio Borg on: 08/11/2019 12:59 PM   Modules accepted: Orders

## 2019-08-12 ENCOUNTER — Ambulatory Visit: Payer: BC Managed Care – PPO | Admitting: Internal Medicine

## 2019-08-12 ENCOUNTER — Encounter: Payer: Self-pay | Admitting: Internal Medicine

## 2019-08-12 DIAGNOSIS — F410 Panic disorder [episodic paroxysmal anxiety] without agoraphobia: Secondary | ICD-10-CM

## 2019-08-12 NOTE — Progress Notes (Signed)
Patient ID: Gina Adams, female   DOB: 16-May-1991, 28 y.o.   MRN: [id number]  Pt no show on doxy

## 2019-08-12 NOTE — Patient Instructions (Signed)
none

## 2019-09-08 ENCOUNTER — Telehealth: Payer: Self-pay | Admitting: Internal Medicine

## 2019-09-08 MED ORDER — AMPHETAMINE-DEXTROAMPHETAMINE 20 MG PO TABS: ORAL_TABLET | ORAL | 0 refills | Status: DC

## 2019-09-08 NOTE — Telephone Encounter (Signed)
Medication Refill - Medication: amphetamine-dextroamphetamine (ADDERALL) 20 MG tablet  Has the patient contacted their pharmacy? No - controlled substance (Agent: If no, request that the patient contact the pharmacy for the refill.) (Agent: If yes, when and what did the pharmacy advise?)  Preferred Pharmacy (with phone number or street name):  [id number] - [place], [address]. AT [address] [phone] (Phone) [phone] (Fax)   Agent: Please be advised that RX refills may take up to 3 business days. We ask that you follow-up with your pharmacy.

## 2019-09-08 NOTE — Telephone Encounter (Signed)
Done erx 

## 2019-09-08 NOTE — Addendum Note (Signed)
Addended by: Biagio Borg on: 09/08/2019 06:59 PM   Modules accepted: Orders

## 2019-09-23 DIAGNOSIS — F4323 Adjustment disorder with mixed anxiety and depressed mood: Secondary | ICD-10-CM | POA: Diagnosis not present

## 2019-09-25 ENCOUNTER — Encounter (HOSPITAL_COMMUNITY): Payer: Self-pay | Admitting: Emergency Medicine

## 2019-09-25 ENCOUNTER — Emergency Department (HOSPITAL_COMMUNITY)
Admission: EM | Admit: 2019-09-25 | Discharge: 2019-09-25 | Disposition: A | Discharge status: Home / Self Care | Payer: BC Managed Care – PPO | Attending: Emergency Medicine | Admitting: Emergency Medicine

## 2019-09-25 ENCOUNTER — Encounter: Payer: Self-pay | Admitting: Internal Medicine

## 2019-09-25 ENCOUNTER — Other Ambulatory Visit: Payer: Self-pay

## 2019-09-25 DIAGNOSIS — R110 Nausea: Secondary | ICD-10-CM | POA: Diagnosis not present

## 2019-09-25 DIAGNOSIS — R42 Dizziness and giddiness: Secondary | ICD-10-CM | POA: Insufficient documentation

## 2019-09-25 DIAGNOSIS — R0789 Other chest pain: Secondary | ICD-10-CM | POA: Diagnosis not present

## 2019-09-25 DIAGNOSIS — Z5321 Procedure and treatment not carried out due to patient leaving prior to being seen by health care provider: Secondary | ICD-10-CM | POA: Insufficient documentation

## 2019-09-25 LAB — COMPREHENSIVE METABOLIC PANEL
ALT: 17 U/L (ref 0–44)
AST: 15 U/L (ref 15–41)
Albumin: 3.8 g/dL (ref 3.5–5.0)
Alkaline Phosphatase: 74 U/L (ref 38–126)
Anion gap: 11 (ref 5–15)
BUN: 11 mg/dL (ref 6–20)
CO2: 22 mmol/L (ref 22–32)
Calcium: 8.9 mg/dL (ref 8.9–10.3)
Chloride: 104 mmol/L (ref 98–111)
Creatinine, Ser: 0.73 mg/dL (ref 0.44–1.00)
GFR calc Af Amer: >60 mL/min (ref >60)
GFR calc non Af Amer: >60 mL/min (ref >60)
Glucose, Bld: 114 mg/dL — ABNORMAL HIGH (ref 70–99)
Potassium: 4.3 mmol/L (ref 3.5–5.1)
Sodium: 137 mmol/L (ref 135–145)
Total Bilirubin: 0.4 mg/dL (ref 0.3–1.2)
Total Protein: 6.9 g/dL (ref 6.5–8.1)

## 2019-09-25 LAB — LACTIC ACID, PLASMA: Lactic Acid, Venous: 1.2 mmol/L (ref 0.5–1.9)

## 2019-09-25 LAB — CBC WITH DIFFERENTIAL/PLATELET
Abs Immature Granulocytes: 0.04 10*3/uL (ref 0.00–0.07)
Basophils Absolute: 0.1 10*3/uL (ref 0.0–0.1)
Basophils Relative: 1 %
Eosinophils Absolute: 0.2 10*3/uL (ref 0.0–0.5)
Eosinophils Relative: 3 %
HCT: 43.6 % (ref 36.0–46.0)
Hemoglobin: 14.2 g/dL (ref 12.0–15.0)
Immature Granulocytes: 1 %
Lymphocytes Relative: 30 %
Lymphs Abs: 2.2 10*3/uL (ref 0.7–4.0)
MCH: 30.5 pg (ref 26.0–34.0)
MCHC: 32.6 g/dL (ref 30.0–36.0)
MCV: 93.6 fL (ref 80.0–100.0)
Monocytes Absolute: 0.5 10*3/uL (ref 0.1–1.0)
Monocytes Relative: 7 %
Neutro Abs: 4.3 10*3/uL (ref 1.7–7.7)
Neutrophils Relative %: 58 %
Platelets: 489 10*3/uL — ABNORMAL HIGH (ref 150–400)
RBC: 4.66 MIL/uL (ref 3.87–5.11)
RDW: 12.9 % (ref 11.5–15.5)
WBC: 7.3 10*3/uL (ref 4.0–10.5)
nRBC: 0 % (ref 0.0–0.2)

## 2019-09-25 LAB — I-STAT BETA HCG BLOOD, ED (MC, WL, AP ONLY): I-stat hCG, quantitative: <5 m[IU]/mL (ref <5)

## 2019-09-25 MED ORDER — SODIUM CHLORIDE 0.9% FLUSH: 3.0000 mL | Freq: Once | INTRAVENOUS | Status: DC

## 2019-09-25 NOTE — ED Triage Notes (Addendum)
States she had breast implants a month ago and last sat she had cp  Just rt  Sided sharp and constant  Nausea , dizzt and tired , called her dr  This past wed she had  Pus coming out of her incision site saw her dr and was placed on antiobiotics  But still feels  Really bad, saw a string in her incision states her incision  Is open

## 2019-09-25 NOTE — ED Notes (Signed)
Called for room multiple times, no answer 

## 2019-10-15 ENCOUNTER — Telehealth: Payer: Self-pay | Admitting: Internal Medicine

## 2019-10-15 MED ORDER — ALPRAZOLAM 1 MG PO TABS: ORAL_TABLET | ORAL | 2 refills | Status: DC

## 2019-10-15 MED ORDER — AMPHETAMINE-DEXTROAMPHETAMINE 20 MG PO TABS: ORAL_TABLET | ORAL | 0 refills | Status: DC

## 2019-10-15 NOTE — Telephone Encounter (Signed)
Done erx 

## 2019-10-15 NOTE — Telephone Encounter (Signed)
amphetamine-dextroamphetamine (ADDERALL) 20 MG tablet ALPRAZolam (XANAX) 1 MG tablet    Patient is requesting refill.    Pharmacy:  [facility] [address] [place] - [address] [phone] (Phone) [phone] (Fax

## 2019-11-09 ENCOUNTER — Telehealth: Payer: Self-pay | Admitting: Internal Medicine

## 2019-11-09 MED ORDER — ALPRAZOLAM 1 MG PO TABS: ORAL_TABLET | ORAL | 2 refills | Status: DC

## 2019-11-09 MED ORDER — AMPHETAMINE-DEXTROAMPHETAMINE 20 MG PO TABS: ORAL_TABLET | ORAL | 0 refills | Status: DC

## 2019-11-09 NOTE — Telephone Encounter (Signed)
Medication Refill - Medication:  ALPRAZolam (XANAX) 1 MG tablet    amphetamine-dextroamphetamine (ADDERALL) 20 MG tablet     Has the patient contacted their pharmacy? Yes - switch pharmacies (Agent: If no, request that the patient contact the pharmacy for the refill.) (Agent: If yes, when and what did the pharmacy advise?)  Preferred Pharmacy (with phone number or street name):  [place], [place] - [address] [phone] (Phone) [phone] (Fax)   Agent: Please be advised that RX refills may take up to 3 business days. We ask that you follow-up with your pharmacy.

## 2019-11-09 NOTE — Telephone Encounter (Signed)
Done erx x 2 

## 2019-11-09 NOTE — Addendum Note (Signed)
Addended by: Biagio Borg on: 11/09/2019 05:39 PM   Modules accepted: Orders

## 2019-11-12 DIAGNOSIS — H1033 Unspecified acute conjunctivitis, bilateral: Secondary | ICD-10-CM | POA: Diagnosis not present

## 2019-11-19 ENCOUNTER — Ambulatory Visit (INDEPENDENT_AMBULATORY_CARE_PROVIDER_SITE_OTHER): Payer: Self-pay | Admitting: Surgical

## 2019-11-19 ENCOUNTER — Encounter: Payer: Self-pay | Admitting: Surgical

## 2019-11-19 ENCOUNTER — Other Ambulatory Visit: Payer: Self-pay

## 2019-11-19 VITALS — BP 140/90 | HR 100 | Temp 97.8°F | Ht 62.0 in | Wt 134.0 lb

## 2019-11-19 DIAGNOSIS — Z9882 Breast implant status: Secondary | ICD-10-CM

## 2019-11-19 DIAGNOSIS — Z4802 Encounter for removal of sutures: Secondary | ICD-10-CM

## 2019-11-19 NOTE — Progress Notes (Signed)
Gina Adams is a 28 year old female here for removal of sutures after right-sided breast augmentation in [place] by Dr. Hinton Rao on November 28. She reports that she reached out to her surgeon and he is currently unavailable according to the patient for medical reasons and he has no nursing support or partners to assist her with suture removal. She is here with her two children.  She underwent bilateral breast augmentation early September and her left breast healed well. She stated her right breast did not do as well and she feels as if it became infected. She reports about 3 weeks after the initial surgery (early September) she had noticed drainage from the right breast and reports she was started on antibiotics. Shortly after, she reports the incision on her right breast had dehisced and she had sutures placed to close it in the office. After closure, she reports that the incision had opened again and she believed she could see the implant at that time. She reports the implant was then exchanged for another implant on November 28th, just short of 2 weeks from today. She does not recall exact implant size, but reports they are approximately 500cc. Since the exchange on November 28th she has been doing well and has healed very well, but she has been unable to see her surgeon for post-op removal of sutures due to reasons stated above. She is having some tenderness to the incision and she feels as if the sutures are beginning to be embedded in her skin. She describes the sutures as previously being exposed and visible, but at this time, I am only able to visualize sutures and a what appears to be a few horizontal mattresses medially and one knot laterally. She was previously on ocrevus and believes some of her poor healing is due to this, her last dose was approximately April/may, she was due for a dose October 31, but did not receive it.   Per EMR review she visited the emergency room on 09/25/2019 for  evaluation of her breast implants. She reported right-sided sharp pain, nausea, dizziness and tiredness.  She also reported at that time pus was coming out of her incision and her doctor had placed her on antibiotics. She left the ED without evaluation by a provider.  BP 140/90 (BP Location: Left Arm, Patient Position: Sitting, Cuff Size: Normal)   Pulse 100   Temp 97.8 F (36.6 C) (Temporal)   Ht 5\' 2"  (1.575 m)   Wt 134 lb (60.8 kg)   LMP 10/25/2019 (Exact Date)   SpO2 99%   BMI 24.51 kg/m   Physical Exam Exam conducted with a chaperone present.  Constitutional:      General: She is not in acute distress.    Appearance: Normal appearance. She is not ill-appearing.  HENT:     Head: Normocephalic and atraumatic.  Neck:     Musculoskeletal: Normal range of motion.  Pulmonary:     Effort: Pulmonary effort is normal.   Breast: L breast transverse incision well healed. R breast incision well healed, with black sutures placed medially along the transverse inframammary fold incision, appearing to be horizontal mattress sutures and a knot along the lateral aspect of the transverse incision.  There appears to be some subcuticular/possible running sutures running between the medial and lateral sutures.  Sutures are monofilament, possibly 3-0. Skin:    General: Skin is warm and dry.     Findings: No erythema or rash.  Neurological:     General: No  focal deficit present.     Mental Status: She is alert and oriented to person, place, and time. Mental status is at baseline.     Motor: No weakness.  Psychiatric:        Mood and Affect: Mood normal.        Behavior: Behavior normal.   Plan:  It is unclear exactly how the closure was done, therefore I refrained from moving additional sutures until we could obtain records to determine if Monocryl or dissolvable sutures were used. I did cut one of the knots at the lateral aspect and removed the suture attached.   At this time, we are unable to  obtain records as to which sutures were used for closure due to her surgeon being out of office, having no office staff to answer and the answering service does not provide this type of support.  Patient is scheduled for follow-up on Tuesday with [place] for evaluation of this right breast with Dr. Ulice Bold. She is in agreement with the plan to wait until Tuesday for reevaluation with Dr. Ulice Bold.  I suspect some of the sutures may be buried in the skin along the midline to lateral aspect -  Unsure if these sutures were intended to be subcuticular as there is some puckering of the skin at this area. It is possible the area between the two black (nylon?) sutures has subcuticular monocryl.

## 2019-11-20 ENCOUNTER — Encounter: Payer: Self-pay | Admitting: Surgical

## 2019-11-24 ENCOUNTER — Ambulatory Visit: Payer: BC Managed Care – PPO | Admitting: Plastic Surgery

## 2019-11-25 DIAGNOSIS — G35 Multiple sclerosis: Secondary | ICD-10-CM | POA: Diagnosis not present

## 2019-12-07 ENCOUNTER — Telehealth: Payer: Self-pay | Admitting: Internal Medicine

## 2019-12-07 NOTE — Telephone Encounter (Signed)
ALPRAZolam (XANAX) 1 MG tablet amphetamine-dextroamphetamine (ADDERALL) 20 MG tablet   Patient is requesting refills.    Pharmacy:  [facility] [address] [place] - [place] [address] Phone:  [phone]  Fax:  [phone]

## 2019-12-08 MED ORDER — ALPRAZOLAM 1 MG PO TABS: ORAL_TABLET | ORAL | 2 refills | Status: DC

## 2019-12-08 NOTE — Telephone Encounter (Signed)
Done erx 

## 2019-12-14 ENCOUNTER — Telehealth: Payer: Self-pay | Admitting: Internal Medicine

## 2019-12-14 NOTE — Telephone Encounter (Signed)
amphetamine-dextroamphetamine (ADDERALL) 20 MG tablet  [facility] [address], [place] - [address] Phone:  [phone]  Fax:  [phone]     Please make sure and send to [employer]

## 2019-12-14 NOTE — Telephone Encounter (Signed)
Pt called and stated that medication Adderall was not called in. Please advise. Pt would like this a soon as possible. Please advise

## 2019-12-15 MED ORDER — AMPHETAMINE-DEXTROAMPHETAMINE 20 MG PO TABS: ORAL_TABLET | ORAL | 0 refills | Status: DC

## 2019-12-15 NOTE — Telephone Encounter (Signed)
Done erx 

## 2019-12-15 NOTE — Telephone Encounter (Signed)
Check [place] registry last filled 11/12/2019../lmb  

## 2019-12-15 NOTE — Telephone Encounter (Signed)
MD just received request for Adderrall this am.. see previous msg. This request was for alprazolam which was sent last week. Closing this encounter.Marland KitchenRaechel Chute

## 2019-12-30 ENCOUNTER — Telehealth: Payer: Self-pay

## 2019-12-30 NOTE — Telephone Encounter (Signed)
Copied from CRM [id number]. Topic: General - Inquiry >> Dec 30, 2019  3:23 PM Lynne Logan D wrote: Reason for CRM: Pt needs medical records faxed to [facility].  FAX# [phone] Attn: Neurology. Please advise.

## 2020-01-04 ENCOUNTER — Telehealth: Payer: Self-pay

## 2020-01-04 MED ORDER — ALPRAZOLAM 1 MG PO TABS: ORAL_TABLET | ORAL | 2 refills | Status: DC

## 2020-01-04 NOTE — Telephone Encounter (Signed)
New message    1. Which medications need to be refilled? (please list name of each medication and dose if known) ALPRAZolam (XANAX) 1 MG tablet  2. Which pharmacy/location (including street and city if local pharmacy) is medication to be sent to? [facility] [address]   3. Do they need a 30 day or 90 day supply? 30 days supply

## 2020-01-04 NOTE — Telephone Encounter (Signed)
Done erx 

## 2020-01-10 DIAGNOSIS — Z20822 Contact with and (suspected) exposure to covid-19: Secondary | ICD-10-CM | POA: Diagnosis not present

## 2020-01-10 DIAGNOSIS — R5383 Other fatigue: Secondary | ICD-10-CM | POA: Diagnosis not present

## 2020-01-10 DIAGNOSIS — F17200 Nicotine dependence, unspecified, uncomplicated: Secondary | ICD-10-CM | POA: Diagnosis not present

## 2020-01-11 ENCOUNTER — Other Ambulatory Visit: Payer: BC Managed Care – PPO

## 2020-01-11 ENCOUNTER — Telehealth: Payer: Self-pay | Admitting: Internal Medicine

## 2020-01-11 MED ORDER — AMPHETAMINE-DEXTROAMPHETAMINE 20 MG PO TABS: ORAL_TABLET | ORAL | 0 refills | Status: DC

## 2020-01-11 NOTE — Telephone Encounter (Signed)
    Refill request: amphetamine-dextroamphetamine (ADDERALL) 20 MG tablet Pharmacy: [id number] [place], [place] - [address] AT [address] 30 day supply

## 2020-01-11 NOTE — Telephone Encounter (Signed)
Done erx 

## 2020-01-12 ENCOUNTER — Ambulatory Visit: Payer: BC Managed Care – PPO | Attending: Internal Medicine

## 2020-01-12 DIAGNOSIS — Z20822 Contact with and (suspected) exposure to covid-19: Secondary | ICD-10-CM

## 2020-01-13 ENCOUNTER — Encounter: Payer: Self-pay | Admitting: Internal Medicine

## 2020-01-13 LAB — NOVEL CORONAVIRUS, NAA: SARS-CoV-2, NAA: NOT DETECTED

## 2020-01-21 DIAGNOSIS — R05 Cough: Secondary | ICD-10-CM | POA: Diagnosis not present

## 2020-01-29 DIAGNOSIS — S60211A Contusion of right wrist, initial encounter: Secondary | ICD-10-CM | POA: Diagnosis not present

## 2020-02-09 ENCOUNTER — Telehealth: Payer: Self-pay

## 2020-02-09 NOTE — Telephone Encounter (Signed)
1.Medication Requested:  ALPRAZolam (XANAX) 1 MG tablet  amphetamine-dextroamphetamine (ADDERALL) 20 MG tablet  2. Pharmacy (Name, Street, [place] [address] [facility]    3. On Med List: Yes   4. Last Visit with PCP: 9.2.20   5. Next visit date with PCP: pending

## 2020-02-10 ENCOUNTER — Other Ambulatory Visit: Payer: Self-pay

## 2020-02-10 ENCOUNTER — Ambulatory Visit: Payer: BC Managed Care – PPO | Admitting: Internal Medicine

## 2020-02-10 MED ORDER — AMPHETAMINE-DEXTROAMPHETAMINE 20 MG PO TABS: ORAL_TABLET | ORAL | 0 refills | Status: DC

## 2020-02-10 NOTE — Telephone Encounter (Signed)
Reviewed medication list. No rx need to be filled, either done or refills are available. Tried to call pt to confirm. No answer. Pt has an appointment tomorrow.   Closing note.

## 2020-02-10 NOTE — Telephone Encounter (Signed)
Done erx 

## 2020-02-10 NOTE — Telephone Encounter (Signed)
Patient requesting a medication bridge until her appt tomorrow, she's out and really needs it      Please call and advise

## 2020-02-11 ENCOUNTER — Other Ambulatory Visit: Payer: Self-pay

## 2020-02-11 ENCOUNTER — Encounter: Payer: Self-pay | Admitting: Internal Medicine

## 2020-02-11 ENCOUNTER — Ambulatory Visit: Payer: BC Managed Care – PPO | Admitting: Internal Medicine

## 2020-02-11 VITALS — BP 140/94 | HR 108 | Temp 98.1°F | Ht 62.0 in | Wt 133.4 lb

## 2020-02-11 DIAGNOSIS — Z0000 Encounter for general adult medical examination without abnormal findings: Secondary | ICD-10-CM

## 2020-02-11 DIAGNOSIS — M2550 Pain in unspecified joint: Secondary | ICD-10-CM | POA: Diagnosis not present

## 2020-02-11 DIAGNOSIS — E611 Iron deficiency: Secondary | ICD-10-CM

## 2020-02-11 DIAGNOSIS — Z0001 Encounter for general adult medical examination with abnormal findings: Secondary | ICD-10-CM

## 2020-02-11 DIAGNOSIS — R739 Hyperglycemia, unspecified: Secondary | ICD-10-CM

## 2020-02-11 DIAGNOSIS — E559 Vitamin D deficiency, unspecified: Secondary | ICD-10-CM

## 2020-02-11 DIAGNOSIS — E538 Deficiency of other specified B group vitamins: Secondary | ICD-10-CM | POA: Diagnosis not present

## 2020-02-11 DIAGNOSIS — M25531 Pain in right wrist: Secondary | ICD-10-CM

## 2020-02-11 DIAGNOSIS — J309 Allergic rhinitis, unspecified: Secondary | ICD-10-CM

## 2020-02-11 MED ORDER — AZELASTINE-FLUTICASONE 137-50 MCG/ACT NA SUSP: NASAL | 5 refills | Status: DC

## 2020-02-11 NOTE — Patient Instructions (Addendum)
Please take all new medication as prescribed - the dymista for the allergies  Please continue all other medications as before, and refills have been done if requested.  Please have the pharmacy call with any other refills you may need.  Please continue your efforts at being more active, low cholesterol diet, and weight control.  You are otherwise up to date with prevention measures today.  Please keep your appointments with your specialists as you may have planned  You will be contacted regarding the referral for: sports medicine, and endocrinology  Please go to the XRAY Department for the x-ray testing at the [facility] office when you want  Please go to the LAB at the blood drawing area for the tests to be done at the [facility] office as well  You will be contacted by phone if any changes need to be made immediately.  Otherwise, you will receive a letter about your results with an explanation, but please check with MyChart first.  Please remember to sign up for MyChart if you have not done so, as this will be important to you in the future with finding out test results, communicating by private email, and scheduling acute appointments online when needed.  Please make an Appointment to return for your 1 year visit, or sooner if needed

## 2020-02-11 NOTE — Progress Notes (Signed)
Subjective:    Patient ID: Gina Adams, female    DOB: 01/16/1991, 29 y.o.   MRN: [id number]  HPI  Here for wellness and f/u;  Overall doing ok;  Pt denies Chest pain, worsening SOB, DOE, wheezing, orthopnea, PND, worsening LE edema, palpitations, dizziness or syncope.  Pt denies neurological change such as new headache, facial or extremity weakness.  Pt denies polydipsia, polyuria, or low sugar symptoms. Pt states overall good compliance with treatment and medications, good tolerability, and has been trying to follow appropriate diet.  Pt denies worsening depressive symptoms, suicidal ideation or panic. No fever, night sweats, wt loss, loss of appetite, or other constitutional symptoms.  Pt states good ability with ADL's, has low fall risk, home safety reviewed and adequate, no other significant changes in hearing or vision, and only occasionally active with exercise. Wt Readings from Last 3 Encounters:  02/11/20 133 lb 6.4 oz (60.5 kg)  11/19/19 134 lb (60.8 kg)  05/02/19 141 lb (64 kg)  Trying to be seen at [facility] for MS, now on disablity  Does also have worsening polyarthralgias, asks for lab eval r/o RA, mostly to hand pain bilateral.  Also had accident where right wrist caught between furniture and wall and heard a crunch.  Also with unusual persistent elevated glucose, asks for endo referral.  Does have several wks ongoing nasal allergy symptoms with clearish congestion, itch and sneezing, without fever, pain, ST, cough, swelling or wheezing. Past Medical History:  Diagnosis Date  . CIN I (cervical intraepithelial neoplasia I) 2012  . Endometriosis   . Multiple sclerosis (HCC) 03/26/2018  . Panic   . Pap smear of cervix with ASCUS, cannot exclude HGSIL 2012   Past Surgical History:  Procedure Laterality Date  . DIAGNOSTIC LAPAROSCOPY    . DILATION AND EVACUATION N/A 08/08/2015   Procedure: DILATATION AND EVACUATION;  Surgeon: Zelphia Cairo, MD;  Location: [facility];  Service:  Gynecology;  Laterality: N/A;  . TONSILLECTOMY    . WISDOM TOOTH EXTRACTION     x4    reports that she has been smoking cigarettes. She has been smoking about 0.50 packs per day. She has never used smokeless tobacco. She reports current alcohol use. She reports that she does not use drugs. family history includes Anxiety disorder in her brother; Ovarian cancer in her mother; Prostate cancer in her maternal grandfather; Skin cancer in her mother. Allergies  Allergen Reactions  . Quetiapine Other (See Comments) and Nausea And Vomiting    Felt like she couldn't get out of bed, felt very weak Felt like she couldn't get out of bed, felt very weak Felt like she couldn't get out of bed, felt very weak Felt like she couldn't get out of bed, felt very weak   Current Outpatient Medications on File Prior to Visit  Medication Sig Dispense Refill  . amphetamine-dextroamphetamine (ADDERALL) 20 MG tablet 1 tab by mouth twice per day 60 tablet 0  . gabapentin (NEURONTIN) 300 MG capsule Take 300 mg by mouth 3 (three) times daily. 2 tabs po TID    . ibuprofen (ADVIL) 200 MG tablet Take 200 mg by mouth every 6 (six) hours as needed. Patient said she takes 2 tabs per day    . Ocrelizumab (OCREVUS IV)     . OIL BASE EX Apply topically. CBD Oil     No current facility-administered medications on file prior to visit.   Review of Systems All otherwise neg per pt  Objective:   Physical Exam BP (!) 140/94   Pulse (!) 108   Temp 98.1 F (36.7 C)   Ht 5\' 2"  (1.575 m)   Wt 133 lb 6.4 oz (60.5 kg)   SpO2 99%   BMI 24.40 kg/m  VS noted,  Constitutional: Pt appears in NAD HENT: Head: NCAT.  Right Ear: External ear normal.  Left Ear: External ear normal.  Eyes: . Pupils are equal, round, and reactive to light. Conjunctivae and EOM are normal Bilat tm's with mild erythema.  Max sinus areas non tender.  Pharynx with mild erythema, no exudate Nose: without d/c or deformity Neck: Neck supple. Gross  normal ROM Cardiovascular: Normal rate and regular rhythm.   Pulmonary/Chest: Effort normal and breath sounds without rales or wheezing.  Right wrist mild tender posteriorly Abd:  Soft, NT, ND, + BS, no organomegaly Neurological: Pt is alert. At baseline orientation, motor grossly intact Skin: Skin is warm. No rashes, other new lesions, no LE edema Psychiatric: Pt behavior is normal without agitation  All otherwise neg per pt Lab Results  Component Value Date   WBC 9.3 02/12/2020   HGB 13.7 02/12/2020   HCT 40.5 02/12/2020   PLT 347.0 02/12/2020   GLUCOSE 89 02/12/2020   CHOL 159 02/12/2020   TRIG 62.0 02/12/2020   HDL 62.00 02/12/2020   LDLCALC 84 02/12/2020   ALT 9 02/12/2020   AST 13 02/12/2020   NA 138 02/12/2020   K 3.6 02/12/2020   CL 106 02/12/2020   CREATININE 0.81 02/12/2020   BUN 18 02/12/2020   CO2 25 02/12/2020   TSH 0.88 02/12/2020   HGBA1C 5.6 02/12/2020      Assessment & Plan:

## 2020-02-11 NOTE — Telephone Encounter (Signed)
Key: [id number]

## 2020-02-12 ENCOUNTER — Telehealth: Payer: Self-pay | Admitting: Internal Medicine

## 2020-02-12 ENCOUNTER — Other Ambulatory Visit (INDEPENDENT_AMBULATORY_CARE_PROVIDER_SITE_OTHER): Payer: BC Managed Care – PPO

## 2020-02-12 DIAGNOSIS — M2550 Pain in unspecified joint: Secondary | ICD-10-CM

## 2020-02-12 DIAGNOSIS — R739 Hyperglycemia, unspecified: Secondary | ICD-10-CM

## 2020-02-12 DIAGNOSIS — E538 Deficiency of other specified B group vitamins: Secondary | ICD-10-CM | POA: Diagnosis not present

## 2020-02-12 DIAGNOSIS — Z0000 Encounter for general adult medical examination without abnormal findings: Secondary | ICD-10-CM | POA: Diagnosis not present

## 2020-02-12 DIAGNOSIS — E611 Iron deficiency: Secondary | ICD-10-CM | POA: Diagnosis not present

## 2020-02-12 DIAGNOSIS — E559 Vitamin D deficiency, unspecified: Secondary | ICD-10-CM

## 2020-02-12 LAB — CBC WITH DIFFERENTIAL/PLATELET
Basophils Absolute: 0.1 10*3/uL (ref 0.0–0.1)
Basophils Relative: 1.2 % (ref 0.0–3.0)
Eosinophils Absolute: 0.3 10*3/uL (ref 0.0–0.7)
Eosinophils Relative: 2.8 % (ref 0.0–5.0)
HCT: 40.5 % (ref 36.0–46.0)
Hemoglobin: 13.7 g/dL (ref 12.0–15.0)
Lymphocytes Relative: 28.5 % (ref 12.0–46.0)
Lymphs Abs: 2.6 10*3/uL (ref 0.7–4.0)
MCHC: 33.8 g/dL (ref 30.0–36.0)
MCV: 88.8 fl (ref 78.0–100.0)
Monocytes Absolute: 0.7 10*3/uL (ref 0.1–1.0)
Monocytes Relative: 7.6 % (ref 3.0–12.0)
Neutro Abs: 5.6 10*3/uL (ref 1.4–7.7)
Neutrophils Relative %: 59.9 % (ref 43.0–77.0)
Platelets: 347 10*3/uL (ref 150.0–400.0)
RBC: 4.57 Mil/uL (ref 3.87–5.11)
RDW: 14.6 % (ref 11.5–15.5)
WBC: 9.3 10*3/uL (ref 4.0–10.5)

## 2020-02-12 LAB — URINALYSIS, ROUTINE W REFLEX MICROSCOPIC
Hgb urine dipstick: NEGATIVE
Ketones, ur: NEGATIVE
Leukocytes,Ua: NEGATIVE
Nitrite: NEGATIVE
Specific Gravity, Urine: >=1.03 — AB (ref 1.000–1.030)
Total Protein, Urine: NEGATIVE
Urine Glucose: NEGATIVE
Urobilinogen, UA: 0.2 (ref 0.0–1.0)
pH: 5.5 (ref 5.0–8.0)

## 2020-02-12 LAB — LIPID PANEL
Cholesterol: 159 mg/dL (ref 0–200)
HDL: 62 mg/dL (ref >39.00)
LDL Cholesterol: 84 mg/dL (ref 0–99)
NonHDL: 96.52
Total CHOL/HDL Ratio: 3
Triglycerides: 62 mg/dL (ref 0.0–149.0)
VLDL: 12.4 mg/dL (ref 0.0–40.0)

## 2020-02-12 LAB — HEPATIC FUNCTION PANEL
ALT: 9 U/L (ref 0–35)
AST: 13 U/L (ref 0–37)
Albumin: 4.5 g/dL (ref 3.5–5.2)
Alkaline Phosphatase: 74 U/L (ref 39–117)
Bilirubin, Direct: 0 mg/dL (ref 0.0–0.3)
Total Bilirubin: 0.3 mg/dL (ref 0.2–1.2)
Total Protein: 7.4 g/dL (ref 6.0–8.3)

## 2020-02-12 LAB — BASIC METABOLIC PANEL
BUN: 18 mg/dL (ref 6–23)
CO2: 25 mEq/L (ref 19–32)
Calcium: 9.4 mg/dL (ref 8.4–10.5)
Chloride: 106 mEq/L (ref 96–112)
Creatinine, Ser: 0.81 mg/dL (ref 0.40–1.20)
GFR: 83.48 mL/min (ref >60.00)
Glucose, Bld: 89 mg/dL (ref 70–99)
Potassium: 3.6 mEq/L (ref 3.5–5.1)
Sodium: 138 mEq/L (ref 135–145)

## 2020-02-12 LAB — VITAMIN D 25 HYDROXY (VIT D DEFICIENCY, FRACTURES): VITD: 29.17 ng/mL — ABNORMAL LOW (ref 30.00–100.00)

## 2020-02-12 LAB — IBC PANEL
Iron: 15 ug/dL — ABNORMAL LOW (ref 42–145)
Saturation Ratios: 3.6 % — ABNORMAL LOW (ref 20.0–50.0)
Transferrin: 299 mg/dL (ref 212.0–360.0)

## 2020-02-12 LAB — VITAMIN B12: Vitamin B-12: 239 pg/mL (ref 211–911)

## 2020-02-12 LAB — SEDIMENTATION RATE: Sed Rate: 3 mm/hr (ref 0–20)

## 2020-02-12 LAB — HEMOGLOBIN A1C: Hgb A1c MFr Bld: 5.6 % (ref 4.6–6.5)

## 2020-02-12 LAB — TSH: TSH: 0.88 u[IU]/mL (ref 0.35–4.50)

## 2020-02-12 MED ORDER — ALPRAZOLAM 1 MG PO TABS: ORAL_TABLET | ORAL | 2 refills | Status: DC

## 2020-02-12 NOTE — Telephone Encounter (Signed)
Done erx 

## 2020-02-12 NOTE — Telephone Encounter (Signed)
    1.Medication Requested:ALPRAZolam (XANAX) 1 MG tablet  2. Pharmacy (Name, Street, [place] [address] [place] [place]  3. On Med List: yes  4. Last Visit with PCP: 02/11/20  5. Next visit date with PCP:   Agent: Please be advised that RX refills may take up to 3 business days. We ask that you follow-up with your pharmacy.

## 2020-02-13 ENCOUNTER — Other Ambulatory Visit: Payer: Self-pay | Admitting: Internal Medicine

## 2020-02-13 ENCOUNTER — Encounter: Payer: Self-pay | Admitting: Internal Medicine

## 2020-02-13 DIAGNOSIS — J309 Allergic rhinitis, unspecified: Secondary | ICD-10-CM | POA: Insufficient documentation

## 2020-02-13 DIAGNOSIS — M2550 Pain in unspecified joint: Secondary | ICD-10-CM | POA: Insufficient documentation

## 2020-02-13 DIAGNOSIS — M25531 Pain in right wrist: Secondary | ICD-10-CM | POA: Insufficient documentation

## 2020-02-13 MED ORDER — POLYSACCHARIDE IRON COMPLEX 150 MG PO CAPS: 150.0000 mg | ORAL_CAPSULE | Freq: Every day | ORAL | 0 refills | Status: AC

## 2020-02-13 MED ORDER — VITAMIN D (ERGOCALCIFEROL) 1.25 MG (50000 UNIT) PO CAPS: 50000.0000 [IU] | ORAL_CAPSULE | ORAL | 0 refills | Status: DC

## 2020-02-13 NOTE — Assessment & Plan Note (Signed)

## 2020-02-13 NOTE — Assessment & Plan Note (Signed)
With recent trauma, for film r/o fx

## 2020-02-13 NOTE — Assessment & Plan Note (Signed)
stable overall by history and exam, recent data reviewed with pt, and pt to continue medical treatment as before,  to f/u any worsening symptoms or concerns  

## 2020-02-13 NOTE — Assessment & Plan Note (Addendum)
Exam benign, for lab as requested  I spent 31 minutes in addition to time for wellness examination in preparing to see the patient by review of recent labs, imaging and procedures, obtaining and reviewing separately obtained history, communicating with the patient and family or caregiver, ordering medications, tests or procedures, and documenting clinical information in the EHR including the differential Dx, treatment, and any further evaluation and other management of polyarthralgias, right wrist pain, hyperglycemia, allergic rhinitis

## 2020-02-13 NOTE — Assessment & Plan Note (Signed)
Ok for [employer] asd,  to f/u any worsening symptoms or concerns

## 2020-02-16 LAB — RHEUMATOID FACTOR: Rheumatoid fact SerPl-aCnc: <14 IU/mL (ref <14)

## 2020-02-16 LAB — CYCLIC CITRUL PEPTIDE ANTIBODY, IGG: Cyclic Citrullin Peptide Ab: <16 UNITS

## 2020-02-19 ENCOUNTER — Encounter: Payer: Self-pay | Admitting: Family Medicine

## 2020-02-19 ENCOUNTER — Ambulatory Visit: Payer: Self-pay

## 2020-02-19 ENCOUNTER — Ambulatory Visit: Payer: BC Managed Care – PPO | Admitting: Family Medicine

## 2020-02-19 ENCOUNTER — Other Ambulatory Visit: Payer: Self-pay

## 2020-02-19 ENCOUNTER — Ambulatory Visit (INDEPENDENT_AMBULATORY_CARE_PROVIDER_SITE_OTHER): Payer: BC Managed Care – PPO

## 2020-02-19 VITALS — BP 110/80 | HR 111 | Ht 62.0 in | Wt 133.2 lb

## 2020-02-19 DIAGNOSIS — G35 Multiple sclerosis: Secondary | ICD-10-CM | POA: Diagnosis not present

## 2020-02-19 DIAGNOSIS — M778 Other enthesopathies, not elsewhere classified: Secondary | ICD-10-CM | POA: Diagnosis not present

## 2020-02-19 DIAGNOSIS — M25531 Pain in right wrist: Secondary | ICD-10-CM

## 2020-02-19 NOTE — Progress Notes (Signed)
X-ray wrist does not show any fractures.  X-ray looks normal.

## 2020-02-19 NOTE — Progress Notes (Signed)
Subjective:    I'm seeing this patient as a consultation for:  Dr. Jenny Reichmann. Note will be routed back to referring provider/PCP.  CC: R wrist pain  I, Gina Adams, LAT, ATC, am serving as scribe for Dr. Lynne Leader.  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  She went to an UC and had a R wrist XR.  Pt locates her pain to her R dorsal ulnar-sided wrist by her ulnar styloid process.  Pt rates her pain at a 2/10 and describes her pain as a constant, sharp pain w/ aggravating activities.  Radiating pain: No Swelling: Questionable Mechanical symptoms: No Numbness/tingling: Yes but has MS Aggravating factors: R wrist flexion AROM; loaded R wrist extension; supination Treatments tried: ice  Past medical history, Surgical history, Family history, Social history, Allergies, and medications have been entered into the medical record, reviewed.  Medical history significant for MS.  Review of Systems: No new headache, visual changes, nausea, vomiting, diarrhea, constipation, dizziness, abdominal pain, skin rash, fevers, chills, night sweats, weight loss, swollen lymph nodes, body aches, joint swelling, muscle aches, chest pain, shortness of breath, mood changes, visual or auditory hallucinations.   Objective:    Vitals:   02/19/20 0856  BP: 110/80  Pulse: (!) 111  SpO2: 99%   General: Well Developed, well nourished, and in no acute distress.  Neuro/Psych: Alert and oriented x3, extra-ocular muscles intact, able to move all 4 extremities, sensation grossly intact. Skin: Warm and dry, no rashes noted.  Respiratory: Not using accessory muscles, speaking in full sentences, trachea midline.  Cardiovascular: Pulses palpable, no extremity edema. Abdomen: Does not appear distended. MSK:  Right wrist: Normal-appearing no significant swelling or deformity. Tender palpation at dorsal aspect of wrist  near ulnar styloid and at ulnar side of wrist at TFCC. Normal wrist motion. Pain with resisted wrist extension.  Pain with passive radial deviation. Some pain also with fifth finger extension. Strength is intact. Stable ligamentous exam. Pulses cap refill and sensation are intact distally.  Lab and Radiology Results  Diagnostic Limited MSK Ultrasound of: Right wrist Fourth dorsal wrist compartment is normal-appearing Fifth dorsal wrist compartment normal-appearing however some pain with palpation of this extensor tendon near ulnar styloid. Sixth dorsal wrist compartment tendon intact.  Slight increase hypoechoic fluid tracking tendon sheath near ulnar styloid. TFCC intact appearing No obvious bony abnormality present. Impression: Extensor carpi ulnaris tendinitis  X-ray images right wrist obtained today personally and independently reviewed No acute fractures visible.  No significant degenerative changes Await formal radiology review  Impression and Recommendations:    Assessment and Plan: 29 y.o. female with right wrist pain following a pinched injury approximately 1 month ago.  X-ray today shows no fractures however radiology overread is still pending. Ultrasound significant for extensor carpi ulnaris tendinitis. Plan for home exercise program, referral to occupational therapy for both the wrist pain as well as for her MS symptoms diclofenac gel and thumb spica splint for use as needed.  Recheck back in 1 month.  Return sooner if needed.   Orders Placed This Encounter  Procedures  . [place] LIMITED JOINT SPACE STRUCTURES UP RIGHT(NO LINKED CHARGES)    Order Specific Question:   Reason for Exam (SYMPTOM  OR DIAGNOSIS REQUIRED)    Answer:   R wrist pain    Order Specific Question:   Preferred imaging location?  Answer:   Adult nurse [facility]  . DG Wrist Complete Right    Standing Status:   Future    Standing Expiration Date:   04/20/2021    Order Specific Question:    Reason for Exam (SYMPTOM  OR DIAGNOSIS REQUIRED)    Answer:   eval right pain. Ulnar    Order Specific Question:   Is patient pregnant?    Answer:   No    Order Specific Question:   Preferred imaging location?    Answer:   [facility]    Order Specific Question:   Radiology Contrast Protocol - do NOT remove file path    Answer:   [email or web address]  . Ambulatory referral to Occupational Therapy    Referral Priority:   Routine    Referral Type:   Occupational Therapy    Referral Reason:   Specialty Services Required    Requested Specialty:   Occupational Therapy    Number of Visits Requested:   1   No orders of the defined types were placed in this encounter.   Discussed warning signs or symptoms. Please see discharge instructions. Patient expresses understanding.   The above documentation has been reviewed and is accurate and complete Clementeen Graham

## 2020-02-19 NOTE — Patient Instructions (Addendum)
Thank you for coming in today. Do the home exercises.  Use the over the counter voltaren gel up to 4x daily for pain.  Use the wrist brace as needed.  Recheck with me in 6 weeks.  Return sooner if needed.   Please perform the exercise program that we have prepared for you and gone over in detail on a daily basis.  In addition to the handout you were provided you can access your program through: [email or web address]   Your unique program code is:  [id number]    Extensor Carpi Ulnaris Tendinitis Tendinitis is inflammation in the tissues that connect muscles to bone (tendons). The extensor carpi ulnaris (ECU) tendon is located on the back of the wrist, on the side that is closest to the small finger. ECU tendinitis will cause pain on the small finger side of the wrist or forearm. It is a common sports-related injury. What are the causes? This condition may be caused by:  Putting repeated stress on your wrist.  Using the wrong technique while playing sports like golf or tennis.  Weakening of the tendon due to age or a health problem.  Injury or trauma. What increases the risk? You are more likely to develop this condition if:  You play sports that involve repeated motions of the wrist, such as golf, tennis, or rugby.  You are 94 years of age or older.  You have an inflammatory condition, such as rheumatoid arthritis. What are the signs or symptoms? Symptoms of this condition include:  Pain along the small finger side of your wrist or forearm when moving your wrist.  A constant ache on the small finger side of your wrist.  Feeling like your grip is weaker than usual.  A popping, snapping, or tearing feeling in your wrist.  Wrist swelling. How is this diagnosed? This condition may be diagnosed based on:  Your symptoms and medical history.  A physical exam. During the exam, your health care provider will check the strength of your grip and may ask you to move your  wrist. Your health care provider may also order an MRI or ultrasound to check for tears in your ligaments, muscles, or tendons. How is this treated? Treatment for this condition may include:  Resting your wrist.  Wearing a splint on your wrist.  Medicines to help with pain and swelling.  Applying heat or ice to the area to ease pain.  Exercises to help you maintain strength and range of motion in your wrist (physical therapy).  Therapy to help with everyday activities (occupational therapy). If these treatments do not help, you may need an injection of an anti-inflammatory medicine (steroid) mixed with a numbing medicine (local anesthetic). Follow these instructions at home: If you have a splint:   Wear it as told by your health care provider. Remove it only as told by your health care provider.  Loosen the splint if your fingers tingle, become numb, or turn cold and blue.  Keep the splint clean.  If the splint is not waterproof: ? Do not let it get wet. ? Cover it with a watertight covering when you take a bath or shower. ? Remove it for bathing and washing your hand only if instructed by your health care provider. Managing pain, stiffness, and swelling      If directed, put ice on the injured area. ? If you have a removable splint, remove it as told by your health care provider. ? Put ice in a plastic bag. ?  Place a towel between your skin and the bag. ? Leave the ice on for 20 minutes, 2-3 times a day.  If directed, apply heat to the affected area as often as told by your health care provider. Use the heat source that your health care provider recommends, such as a moist heat pack or a heating pad. ? If you have a removable splint, remove it as told by your health care provider. ? Place a towel between your skin and the heat source. ? Leave the heat on for 15-20 minutes. ? Remove the heat if your skin turns bright red. This is especially important if you are unable to  feel pain, heat, or cold. You may have a greater risk of getting burned.  Move your fingers often to reduce stiffness and swelling.  Raise (elevate) the injured area above the level of your heart while you are sitting or lying down. Activity  Avoid activities that cause pain or put strain on your wrist for as long as told by your health care provider.  Do not use your injured hand to support your body weight until your health care provider says that you can.  Return to your normal activities as told by your health care provider. Ask your health care provider what activities are safe for you.  Do exercises as told by your health care provider.  Ask your health care provider when it is safe to drive if you have a splint on your wrist. General instructions  Take over-the-counter and prescription medicines only as told by your health care provider.  Do not use any products that contain nicotine or tobacco, such as cigarettes, e-cigarettes, and chewing tobacco. These can delay healing. If you need help quitting, ask your health care provider.  Keep all follow-up visits as told by your health care provider. This is important. How is this prevented?  Warm up and stretch before being active.  Cool down and stretch after being active.  Give your body time to rest between periods of activity.  Use equipment that fits you and is in good condition.  If you play golf or racket sports, focus on proper form or try to improve your technique.  Maintain physical fitness, including: ? Strength. ? Flexibility. ? Endurance. Contact a health care provider if:  Your pain, tenderness, or swelling gets worse, even if you have had treatment. Get help right away if:  Your pain becomes severe.  You cannot move your wrist. Summary  Extensor carpi ulnaris tendinitis is a condition that will cause pain on the small finger side of the wrist or forearm.  Treatment for this condition may include  wearing a splint, taking pain medicines, applying heat or ice, and physical or occupational therapy.  Avoid activities that cause pain or put strain on your wrist for as long as told by your health care provider.  Return to your normal activities as told by your health care provider. This information is not intended to replace advice given to you by your health care provider. Make sure you discuss any questions you have with your health care provider. Document Revised: 03/24/2019 Document Reviewed: 01/15/2019 Elsevier Patient Education  [place].

## 2020-02-22 DIAGNOSIS — G35 Multiple sclerosis: Secondary | ICD-10-CM | POA: Diagnosis not present

## 2020-02-22 DIAGNOSIS — Z79899 Other long term (current) drug therapy: Secondary | ICD-10-CM | POA: Diagnosis not present

## 2020-02-23 DIAGNOSIS — Z20822 Contact with and (suspected) exposure to covid-19: Secondary | ICD-10-CM | POA: Diagnosis not present

## 2020-02-23 DIAGNOSIS — T7840XA Allergy, unspecified, initial encounter: Secondary | ICD-10-CM | POA: Diagnosis not present

## 2020-02-23 DIAGNOSIS — N915 Oligomenorrhea, unspecified: Secondary | ICD-10-CM | POA: Diagnosis not present

## 2020-03-03 ENCOUNTER — Telehealth: Payer: Self-pay

## 2020-03-03 NOTE — Telephone Encounter (Signed)
Key: [id number]

## 2020-03-04 ENCOUNTER — Telehealth: Payer: Self-pay | Admitting: Internal Medicine

## 2020-03-04 NOTE — Telephone Encounter (Signed)
Obviously no need for refill since both of these were done 3/3 and 3/5  PLEASE  - it is OK to reject med refill request that have already been done!

## 2020-03-04 NOTE — Telephone Encounter (Signed)
    1.Medication Requested: ALPRAZolam (XANAX) 1 MG tablet amphetamine-dextroamphetamine (ADDERALL) 20 MG tablet   2. Pharmacy (Name, Street, [place]): [place], [address]  3. On Med List: yes  4. Last Visit with PCP: 02/11/20 5. Next visit date with PCP:   Agent: Please be advised that RX refills may take up to 3 business days. We ask that you follow-up with your pharmacy.

## 2020-03-15 MED ORDER — AMPHETAMINE-DEXTROAMPHETAMINE 20 MG PO TABS: ORAL_TABLET | ORAL | 0 refills | Status: DC

## 2020-03-15 MED ORDER — ALPRAZOLAM 1 MG PO TABS: ORAL_TABLET | ORAL | 2 refills | Status: DC

## 2020-03-15 NOTE — Addendum Note (Signed)
Addended by: Corwin Levins on: 03/15/2020 05:00 PM   Modules accepted: Orders

## 2020-03-15 NOTE — Telephone Encounter (Signed)
    Patient request Adderall and [facility]ax [id number] [place],  - [address] AT [address]NER OF [facility] [address]

## 2020-03-15 NOTE — Telephone Encounter (Signed)
Done erx 

## 2020-04-01 ENCOUNTER — Ambulatory Visit: Payer: BC Managed Care – PPO | Admitting: Family Medicine

## 2020-04-01 DIAGNOSIS — Z0289 Encounter for other administrative examinations: Secondary | ICD-10-CM

## 2020-04-01 NOTE — Progress Notes (Deleted)
   I, Christoper Fabian, LAT, ATC, am serving as scribe for Dr. Clementeen Graham.  FELESHIA ZUNDEL is a 29 y.o. female who presents to [facility] today for f/u of R wrist pain.  She was last seen by Dr. Denyse Amass on 02/19/20 and was provided a HEP focusing on wrist eccentric strengthening and wrist stretching.  She was also provided a wrist brace.  Since her last visit, pt reports   Diagnostic testing: R wrist XR- 02/19/20   Pertinent review of systems: ***  Relevant historical information: ***   Exam:  There were no vitals taken for this visit. General: Well Developed, well nourished, and in no acute distress.   MSK: ***    Lab and Radiology Results No results found for this or any previous visit (from the past 72 hour(s)). No results found.     Assessment and Plan: 29 y.o. female with ***   PDMP not reviewed this encounter. No orders of the defined types were placed in this encounter.  No orders of the defined types were placed in this encounter.    Discussed warning signs or symptoms. Please see discharge instructions. Patient expresses understanding.   ***

## 2020-04-06 DIAGNOSIS — H40013 Open angle with borderline findings, low risk, bilateral: Secondary | ICD-10-CM | POA: Diagnosis not present

## 2020-04-06 DIAGNOSIS — H04123 Dry eye syndrome of bilateral lacrimal glands: Secondary | ICD-10-CM | POA: Diagnosis not present

## 2020-04-06 DIAGNOSIS — G35 Multiple sclerosis: Secondary | ICD-10-CM | POA: Diagnosis not present

## 2020-04-06 DIAGNOSIS — H5213 Myopia, bilateral: Secondary | ICD-10-CM | POA: Diagnosis not present

## 2020-04-12 ENCOUNTER — Telehealth: Payer: Self-pay

## 2020-04-12 NOTE — Telephone Encounter (Signed)
1.Medication Requested:ALPRAZolam (XANAX) 1 MG tablet amphetamine-dextroamphetamine (ADDERALL) 20 MG tablet  2. Pharmacy (Name, Street, City):[phone] [place] - [address] AT [address]NER OF [facility] [address]  3. On Med List: Yes   4. Last Visit with PCP: 3.4.2021   5. Next visit date with PCP: no appt is made at this time     Agent: Please be advised that RX refills may take up to 3 business days. We ask that you follow-up with your pharmacy.

## 2020-04-13 ENCOUNTER — Ambulatory Visit: Payer: BC Managed Care – PPO | Admitting: Internal Medicine

## 2020-04-13 DIAGNOSIS — Z0289 Encounter for other administrative examinations: Secondary | ICD-10-CM

## 2020-04-13 MED ORDER — AMPHETAMINE-DEXTROAMPHETAMINE 20 MG PO TABS: ORAL_TABLET | ORAL | 0 refills | Status: DC

## 2020-04-13 NOTE — Telephone Encounter (Signed)
Called pt there was no answer LMOM w/MD response../lmb 

## 2020-04-13 NOTE — Telephone Encounter (Signed)
No for xanax as she has refills  Ok for adderal - done erx

## 2020-04-14 ENCOUNTER — Ambulatory Visit: Payer: BC Managed Care – PPO | Admitting: Psychology

## 2020-05-04 ENCOUNTER — Encounter: Payer: Self-pay | Admitting: Internal Medicine

## 2020-05-04 DIAGNOSIS — L709 Acne, unspecified: Secondary | ICD-10-CM

## 2020-05-13 DIAGNOSIS — L700 Acne vulgaris: Secondary | ICD-10-CM | POA: Diagnosis not present

## 2020-05-17 ENCOUNTER — Telehealth: Payer: Self-pay | Admitting: Internal Medicine

## 2020-05-17 MED ORDER — AMPHETAMINE-DEXTROAMPHETAMINE 20 MG PO TABS: ORAL_TABLET | ORAL | 0 refills | Status: DC

## 2020-05-17 NOTE — Telephone Encounter (Signed)
New message;    1.Medication Requested: amphetamine-dextroamphetamine (ADDERALL) 20 MG tablet 2. Pharmacy (Name, Street, [place]): [phone] [place], [place] - [address] AT [address]NER OF [facility] [address] 3. On Med List: Yes  4. Last Visit with PCP: 02/11/20  5. Next visit date with PCP: None   Agent: Please be advised that RX refills may take up to 3 business days. We ask that you follow-up with your pharmacy.

## 2020-05-17 NOTE — Telephone Encounter (Signed)
Done erx 

## 2020-05-31 ENCOUNTER — Ambulatory Visit: Payer: BC Managed Care – PPO | Admitting: Internal Medicine

## 2020-05-31 DIAGNOSIS — Z0289 Encounter for other administrative examinations: Secondary | ICD-10-CM

## 2020-05-31 IMAGING — DX PORTABLE CHEST - 1 VIEW
1 series · 1 of 1 positions shown · non-contrast
Comparison: 01/25/2019

CLINICAL DATA: Cough, shortness of breath, and chest pain.
Tachycardia. On steroid treatment for multiple sclerosis.

EXAM:
PORTABLE CHEST 1 VIEW

[chest ap]
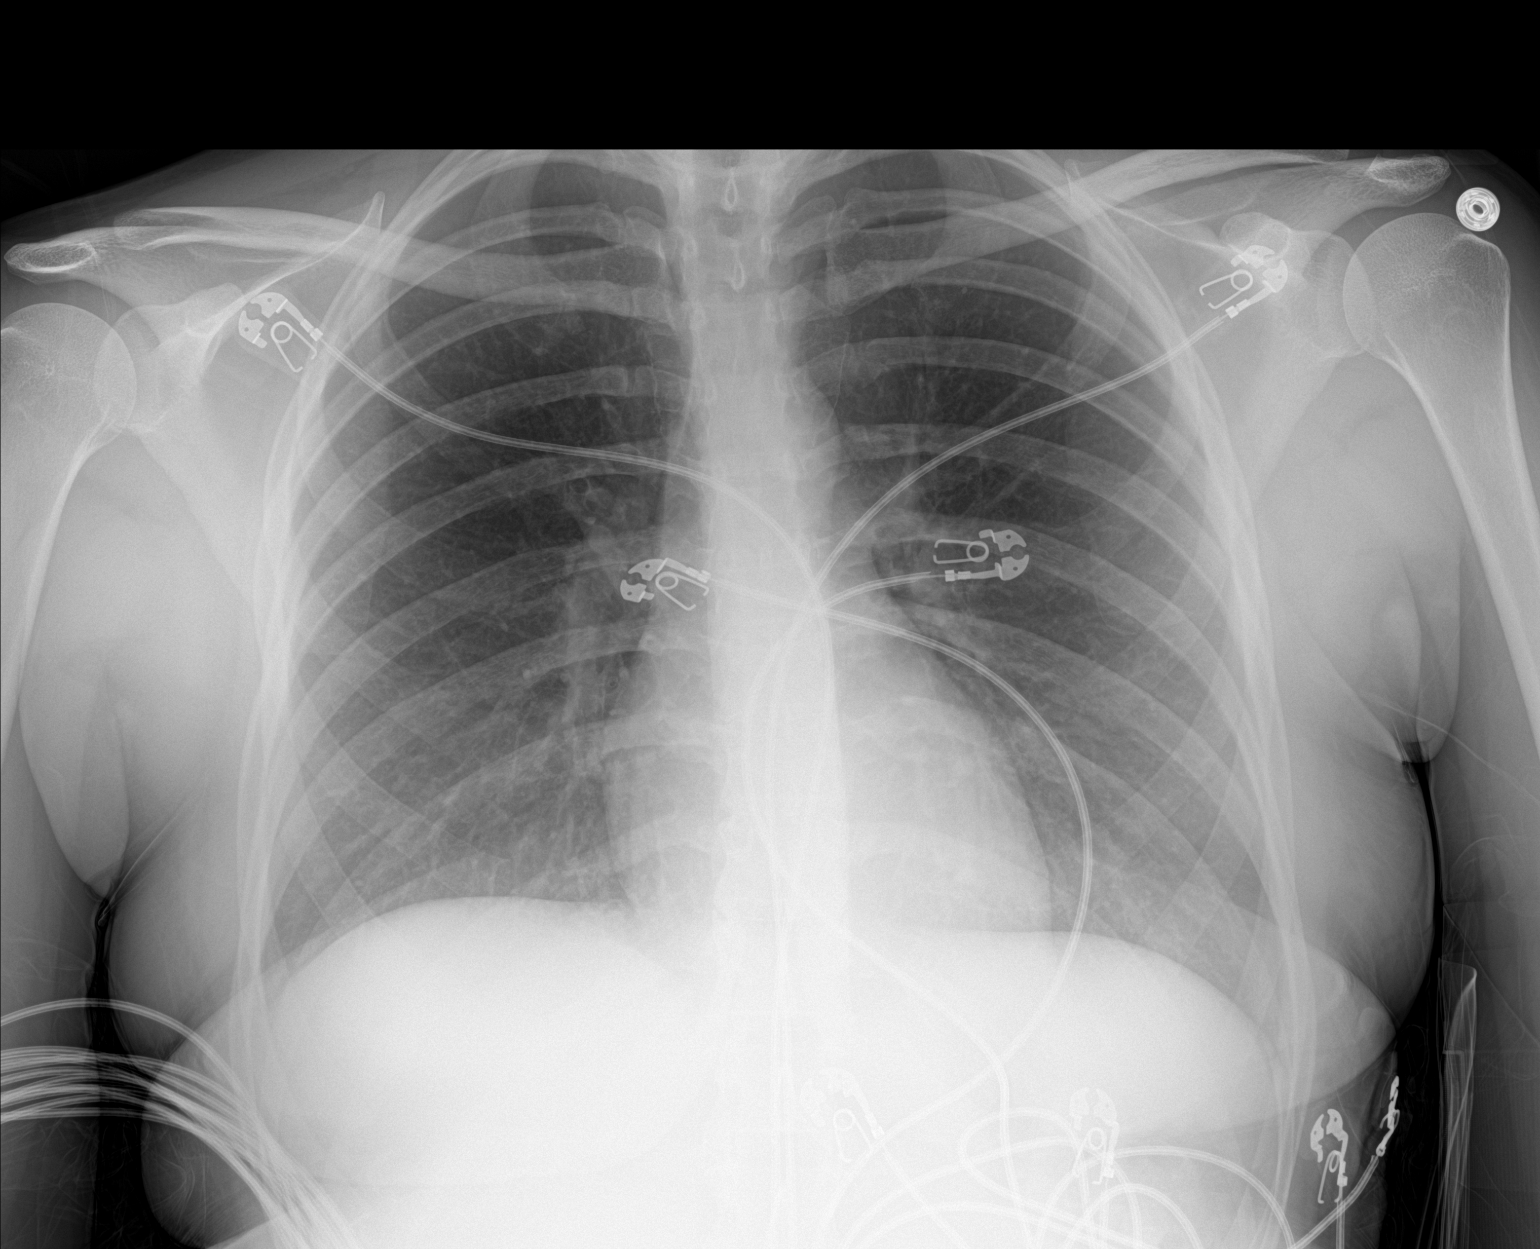

[1 of 1 positions shown; findings below may reference images not displayed]

FINDINGS: The heart size and mediastinal contours are within normal limits.
Both lungs are clear. The visualized skeletal structures are
unremarkable.
IMPRESSION: No active disease.

## 2020-06-07 DIAGNOSIS — Z79899 Other long term (current) drug therapy: Secondary | ICD-10-CM | POA: Diagnosis not present

## 2020-06-07 DIAGNOSIS — G35 Multiple sclerosis: Secondary | ICD-10-CM | POA: Diagnosis not present

## 2020-06-10 DIAGNOSIS — F411 Generalized anxiety disorder: Secondary | ICD-10-CM | POA: Diagnosis not present

## 2020-06-10 DIAGNOSIS — Z630 Problems in relationship with spouse or partner: Secondary | ICD-10-CM | POA: Diagnosis not present

## 2020-06-16 ENCOUNTER — Telehealth: Payer: Self-pay | Admitting: Internal Medicine

## 2020-06-16 NOTE — Telephone Encounter (Signed)
   1.Medication Requested: ALPRAZolam (XANAX) 1 MG tablet amphetamine-dextroamphetamine (ADDERALL) 20 MG tablet  2. Pharmacy (Name, Street, [place]): [phone] [place], [place] - [address] AT [address] OF [facility] [address]  3. On Med List: yes  4. Last Visit with PCP: 02/11/20  5. Next visit date with PCP: 06/24/20   Agent: Please be advised that RX refills may take up to 3 business days. We ask that you follow-up with your pharmacy.

## 2020-06-17 ENCOUNTER — Telehealth: Payer: Self-pay | Admitting: Internal Medicine

## 2020-06-17 MED ORDER — AMPHETAMINE-DEXTROAMPHETAMINE 20 MG PO TABS: ORAL_TABLET | ORAL | 0 refills | Status: DC

## 2020-06-17 MED ORDER — ALPRAZOLAM 1 MG PO TABS: ORAL_TABLET | ORAL | 2 refills | Status: DC

## 2020-06-17 NOTE — Telephone Encounter (Signed)
    Patient calling for status of Xanax

## 2020-06-17 NOTE — Telephone Encounter (Signed)
Sent to Dr. John. 

## 2020-06-17 NOTE — Telephone Encounter (Signed)
Done erx 

## 2020-06-23 DIAGNOSIS — F17210 Nicotine dependence, cigarettes, uncomplicated: Secondary | ICD-10-CM | POA: Diagnosis not present

## 2020-06-23 DIAGNOSIS — H539 Unspecified visual disturbance: Secondary | ICD-10-CM | POA: Diagnosis not present

## 2020-06-23 DIAGNOSIS — H469 Unspecified optic neuritis: Secondary | ICD-10-CM | POA: Diagnosis not present

## 2020-06-23 DIAGNOSIS — G35 Multiple sclerosis: Secondary | ICD-10-CM | POA: Diagnosis not present

## 2020-06-23 DIAGNOSIS — H538 Other visual disturbances: Secondary | ICD-10-CM | POA: Diagnosis not present

## 2020-06-24 ENCOUNTER — Ambulatory Visit: Payer: BC Managed Care – PPO | Admitting: Internal Medicine

## 2020-06-24 DIAGNOSIS — G35 Multiple sclerosis: Secondary | ICD-10-CM | POA: Diagnosis not present

## 2020-06-24 DIAGNOSIS — Z630 Problems in relationship with spouse or partner: Secondary | ICD-10-CM | POA: Diagnosis not present

## 2020-06-24 DIAGNOSIS — Z0289 Encounter for other administrative examinations: Secondary | ICD-10-CM

## 2020-06-24 DIAGNOSIS — F411 Generalized anxiety disorder: Secondary | ICD-10-CM | POA: Diagnosis not present

## 2020-06-24 DIAGNOSIS — H40013 Open angle with borderline findings, low risk, bilateral: Secondary | ICD-10-CM | POA: Diagnosis not present

## 2020-06-24 DIAGNOSIS — F17210 Nicotine dependence, cigarettes, uncomplicated: Secondary | ICD-10-CM | POA: Diagnosis not present

## 2020-06-24 DIAGNOSIS — H04123 Dry eye syndrome of bilateral lacrimal glands: Secondary | ICD-10-CM | POA: Diagnosis not present

## 2020-06-24 DIAGNOSIS — H538 Other visual disturbances: Secondary | ICD-10-CM | POA: Diagnosis not present

## 2020-06-28 DIAGNOSIS — L700 Acne vulgaris: Secondary | ICD-10-CM | POA: Diagnosis not present

## 2020-06-30 ENCOUNTER — Encounter: Payer: Self-pay | Admitting: Internal Medicine

## 2020-06-30 ENCOUNTER — Ambulatory Visit (INDEPENDENT_AMBULATORY_CARE_PROVIDER_SITE_OTHER): Payer: BC Managed Care – PPO | Admitting: Internal Medicine

## 2020-06-30 ENCOUNTER — Other Ambulatory Visit: Payer: Self-pay

## 2020-06-30 ENCOUNTER — Ambulatory Visit (INDEPENDENT_AMBULATORY_CARE_PROVIDER_SITE_OTHER): Payer: BC Managed Care – PPO

## 2020-06-30 VITALS — BP 120/90 | HR 111 | Temp 98.2°F | Ht 62.0 in | Wt 140.0 lb

## 2020-06-30 DIAGNOSIS — K219 Gastro-esophageal reflux disease without esophagitis: Secondary | ICD-10-CM | POA: Diagnosis not present

## 2020-06-30 DIAGNOSIS — M79601 Pain in right arm: Secondary | ICD-10-CM | POA: Insufficient documentation

## 2020-06-30 DIAGNOSIS — M25511 Pain in right shoulder: Secondary | ICD-10-CM | POA: Diagnosis not present

## 2020-06-30 DIAGNOSIS — M549 Dorsalgia, unspecified: Secondary | ICD-10-CM

## 2020-06-30 DIAGNOSIS — G35 Multiple sclerosis: Secondary | ICD-10-CM

## 2020-06-30 DIAGNOSIS — R1310 Dysphagia, unspecified: Secondary | ICD-10-CM

## 2020-06-30 DIAGNOSIS — R739 Hyperglycemia, unspecified: Secondary | ICD-10-CM

## 2020-06-30 DIAGNOSIS — F410 Panic disorder [episodic paroxysmal anxiety] without agoraphobia: Secondary | ICD-10-CM

## 2020-06-30 MED ORDER — CYCLOBENZAPRINE HCL 5 MG PO TABS: 5.0000 mg | ORAL_TABLET | Freq: Three times a day (TID) | ORAL | 1 refills | Status: AC | PRN

## 2020-06-30 MED ORDER — PANTOPRAZOLE SODIUM 40 MG PO TBEC: 40.0000 mg | DELAYED_RELEASE_TABLET | Freq: Every day | ORAL | 3 refills | Status: AC

## 2020-06-30 MED ORDER — MELOXICAM 15 MG PO TABS: 15.0000 mg | ORAL_TABLET | Freq: Every day | ORAL | 0 refills | Status: DC | PRN

## 2020-06-30 NOTE — Patient Instructions (Signed)
Please take all new medication as prescribed - the anti-inflammatory, muscle relaxer as needed, and the protonix for reflux  Please continue all other medications as before, and refills have been done if requested.  Please have the pharmacy call with any other refills you may need.  Please keep your appointments with your specialists as you may have planned  Please go to the XRAY Department in the first floor for the x-ray testing  We may need to consider Gastroenterology or Neurology if you are not improving

## 2020-06-30 NOTE — Progress Notes (Addendum)
Subjective:    Patient ID: Gina Adams, female    DOB: Nov 06, 1991, 29 y.o.   MRN: [id number]  HPI  Here after recent COVID #1 vaccination injection done July 20 still some arm soreness, but next day noted onset of back pain between the shoulder blades left > right for unclear reason, mild to mod but persistent, intermittent, dull and sharp, not better with heating pad, and not really worse with deep breathing but maybe to moving the arms and shoulders, but also seems to maybe be related to recurring SSCP in the low center associated with taking solids po, with this pain somewhat pleuritic, severe at times. Has had worsening intermittent reflux recently but Denies worsening n/v, bowel change or blood.  Denies HA, ST, cough, fever, chills, sob, other abd pain, diarrhea, constipation, dysuria or blood.  Pt denies new neurological symptoms such as new headache, or facial or extremity weakness or numbness   Pt denies polydipsia, polyuria. Denies worsening depressive symptoms, suicidal ideation, or recent panic Past Medical History:  Diagnosis Date  . CIN I (cervical intraepithelial neoplasia I) 2012  . Endometriosis   . Multiple sclerosis (HCC) 03/26/2018  . Panic   . Pap smear of cervix with ASCUS, cannot exclude HGSIL 2012   Past Surgical History:  Procedure Laterality Date  . DIAGNOSTIC LAPAROSCOPY    . DILATION AND EVACUATION N/A 08/08/2015   Procedure: DILATATION AND EVACUATION;  Surgeon: Zelphia Cairo, MD;  Location: [facility];  Service: Gynecology;  Laterality: N/A;  . TONSILLECTOMY    . WISDOM TOOTH EXTRACTION     x4    reports that she has been smoking cigarettes. She has been smoking about 0.50 packs per day. She has never used smokeless tobacco. She reports current alcohol use. She reports that she does not use drugs. family history includes Anxiety disorder in her brother; Ovarian cancer in her mother; Prostate cancer in her maternal grandfather; Skin cancer in her  mother. Allergies  Allergen Reactions  . Quetiapine Other (See Comments) and Nausea And Vomiting    Felt like she couldn't get out of bed, felt very weak Felt like she couldn't get out of bed, felt very weak Felt like she couldn't get out of bed, felt very weak Felt like she couldn't get out of bed, felt very weak   Current Outpatient Medications on File Prior to Visit  Medication Sig Dispense Refill  . ALPRAZolam (XANAX) 1 MG tablet TAKE 1/2 TO 1 TABLET daily AS NEEDED FOR PANIC 30 tablet 2  . amphetamine-dextroamphetamine (ADDERALL) 20 MG tablet 1 tab by mouth twice per day 60 tablet 0  . Azelastine-Fluticasone 137-50 MCG/ACT SUSP 1 spray each side twice per day 23 g 5  . gabapentin (NEURONTIN) 300 MG capsule Take 300 mg by mouth 3 (three) times daily. 2 tabs po TID    . ibuprofen (ADVIL) 200 MG tablet Take 200 mg by mouth every 6 (six) hours as needed. Patient said she takes 2 tabs per day    . iron polysaccharides (NU-IRON) 150 MG capsule Take 1 capsule (150 mg total) by mouth daily. 90 capsule 0  . minocycline (MINOCIN) 100 MG capsule Take 100 mg by mouth 2 (two) times daily.    [employer] (OCREVUS IV)     . OIL BASE EX Apply topically. CBD Oil     No current facility-administered medications on file prior to visit.   Review of Systems All otherwise neg per pt     Objective:  Physical Exam BP (!) 120/90 (BP Location: Left Arm, Patient Position: Sitting, Cuff Size: Large)   Pulse (!) 111   Temp 98.2 F (36.8 C) (Oral)   Ht 5\' 2"  (1.575 m)   Wt 140 lb (63.5 kg)   LMP 06/26/2020   SpO2 98%   BMI 25.61 kg/m  VS noted,  Constitutional: Pt appears in NAD HENT: Head: NCAT.  Right Ear: External ear normal.  Left Ear: External ear normal.  Eyes: . Pupils are equal, round, and reactive to light. Conjunctivae and EOM are normal Nose: without d/c or deformity Neck: Neck supple. Gross normal ROM Cardiovascular: Normal rate and regular rhythm.   Pulmonary/Chest: Effort  normal and breath sounds without rales or wheezing.  Abd:  Soft, NT, ND, + BS, no organomegaly Neurological: Pt is alert. At baseline orientation, motor grossly intact Skin: Skin is warm. No rashes, other new lesions, no LE edema Psychiatric: Pt behavior is normal without agitation  All otherwise neg per pt Lab Results  Component Value Date   WBC 9.3 02/12/2020   HGB 13.7 02/12/2020   HCT 40.5 02/12/2020   PLT 347.0 02/12/2020   GLUCOSE 89 02/12/2020   CHOL 159 02/12/2020   TRIG 62.0 02/12/2020   HDL 62.00 02/12/2020   LDLCALC 84 02/12/2020   ALT 9 02/12/2020   AST 13 02/12/2020   NA 138 02/12/2020   K 3.6 02/12/2020   CL 106 02/12/2020   CREATININE 0.81 02/12/2020   BUN 18 02/12/2020   CO2 25 02/12/2020   TSH 0.88 02/12/2020   HGBA1C 5.6 02/12/2020      Assessment & Plan:

## 2020-07-01 ENCOUNTER — Encounter: Payer: Self-pay | Admitting: Internal Medicine

## 2020-07-01 DIAGNOSIS — F411 Generalized anxiety disorder: Secondary | ICD-10-CM | POA: Diagnosis not present

## 2020-07-01 DIAGNOSIS — Z630 Problems in relationship with spouse or partner: Secondary | ICD-10-CM | POA: Diagnosis not present

## 2020-07-03 ENCOUNTER — Encounter: Payer: Self-pay | Admitting: Internal Medicine

## 2020-07-03 NOTE — Assessment & Plan Note (Signed)
?   Esophageal dysmotility vs other, to consider GI but wants to try other tx as above for now

## 2020-07-03 NOTE — Assessment & Plan Note (Signed)
To cont f/u neurology

## 2020-07-03 NOTE — Assessment & Plan Note (Signed)
For Protonix 40 qd

## 2020-07-03 NOTE — Assessment & Plan Note (Signed)
stable overall by history and exam, recent data reviewed with pt, and pt to continue medical treatment as before,  to f/u any worsening symptoms or concerns  

## 2020-07-03 NOTE — Assessment & Plan Note (Signed)
Post covid vaccine #1 - stable expected,  to f/u any worsening symptoms or concerns

## 2020-07-03 NOTE — Assessment & Plan Note (Addendum)
Etiology unclear, suspect most likely msk, but cant r/o referred pain with esophagus etiology as well; for eval and tx as above, cxr today, nsaid prn and muscle relaxer prn  I spent 41 minutes in preparing to see the patient by review of recent labs, imaging and procedures, obtaining and reviewing separately obtained history, communicating with the patient and family or caregiver, ordering medications, tests or procedures, and documenting clinical information in the EHR including the differential Dx, treatment, and any further evaluation and other management of back pain, dysphagia, gerd, MM, and right arm soreness

## 2020-07-06 DIAGNOSIS — G35 Multiple sclerosis: Secondary | ICD-10-CM | POA: Diagnosis not present

## 2020-07-08 DIAGNOSIS — F411 Generalized anxiety disorder: Secondary | ICD-10-CM | POA: Diagnosis not present

## 2020-07-08 DIAGNOSIS — Z630 Problems in relationship with spouse or partner: Secondary | ICD-10-CM | POA: Diagnosis not present

## 2020-07-15 DIAGNOSIS — R5383 Other fatigue: Secondary | ICD-10-CM | POA: Diagnosis not present

## 2020-07-15 DIAGNOSIS — G35 Multiple sclerosis: Secondary | ICD-10-CM | POA: Diagnosis not present

## 2020-07-15 DIAGNOSIS — Z79899 Other long term (current) drug therapy: Secondary | ICD-10-CM | POA: Diagnosis not present

## 2020-07-15 DIAGNOSIS — Z630 Problems in relationship with spouse or partner: Secondary | ICD-10-CM | POA: Diagnosis not present

## 2020-07-15 DIAGNOSIS — R4189 Other symptoms and signs involving cognitive functions and awareness: Secondary | ICD-10-CM | POA: Diagnosis not present

## 2020-07-15 DIAGNOSIS — F411 Generalized anxiety disorder: Secondary | ICD-10-CM | POA: Diagnosis not present

## 2020-07-19 ENCOUNTER — Telehealth: Payer: Self-pay | Admitting: Internal Medicine

## 2020-07-19 NOTE — Telephone Encounter (Signed)
New message:   1.Medication Requested: ALPRAZolam (XANAX) 1 MG tablet amphetamine-dextroamphetamine (ADDERALL) 20 MG tablet Azelastine-Fluticasone 137-50 MCG/ACT SUSP 2. Pharmacy (Name, Street, [place]): [phone] [place], [place] - [address] AT CORNER OF [facility] [address] 3. On Med List: yes  4. Last Visit with PCP: 06/30/20  5. Next visit date with PCP: None   Agent: Please be advised that RX refills may take up to 3 business days. We ask that you follow-up with your pharmacy.

## 2020-07-20 DIAGNOSIS — Z630 Problems in relationship with spouse or partner: Secondary | ICD-10-CM | POA: Diagnosis not present

## 2020-07-20 DIAGNOSIS — F411 Generalized anxiety disorder: Secondary | ICD-10-CM | POA: Diagnosis not present

## 2020-07-20 MED ORDER — AMPHETAMINE-DEXTROAMPHETAMINE 20 MG PO TABS: ORAL_TABLET | ORAL | 0 refills | Status: DC

## 2020-07-20 MED ORDER — AZELASTINE-FLUTICASONE 137-50 MCG/ACT NA SUSP: NASAL | 5 refills | Status: DC

## 2020-07-20 NOTE — Telephone Encounter (Signed)
Xanax too early  Ok adderral and nasal spray

## 2020-07-20 NOTE — Telephone Encounter (Signed)
Sent to Dr. John. 

## 2020-07-22 DIAGNOSIS — Z630 Problems in relationship with spouse or partner: Secondary | ICD-10-CM | POA: Diagnosis not present

## 2020-07-22 DIAGNOSIS — F411 Generalized anxiety disorder: Secondary | ICD-10-CM | POA: Diagnosis not present

## 2020-07-23 ENCOUNTER — Other Ambulatory Visit: Payer: Self-pay | Admitting: Internal Medicine

## 2020-07-23 NOTE — Telephone Encounter (Signed)
Please refill as per office routine med refill policy (all routine meds refilled for 3 mo or monthly per pt preference up to one year from last visit, then month to month grace period for 3 mo, then further med refills will have to be denied)  

## 2020-07-26 NOTE — Telephone Encounter (Signed)
Key: [id number]  Determination that PA is already on file until 03/03/2021.

## 2020-07-28 DIAGNOSIS — Z630 Problems in relationship with spouse or partner: Secondary | ICD-10-CM | POA: Diagnosis not present

## 2020-07-28 DIAGNOSIS — F411 Generalized anxiety disorder: Secondary | ICD-10-CM | POA: Diagnosis not present

## 2020-07-29 DIAGNOSIS — Z630 Problems in relationship with spouse or partner: Secondary | ICD-10-CM | POA: Diagnosis not present

## 2020-07-29 DIAGNOSIS — F411 Generalized anxiety disorder: Secondary | ICD-10-CM | POA: Diagnosis not present

## 2020-08-04 DIAGNOSIS — Z630 Problems in relationship with spouse or partner: Secondary | ICD-10-CM | POA: Diagnosis not present

## 2020-08-04 DIAGNOSIS — F411 Generalized anxiety disorder: Secondary | ICD-10-CM | POA: Diagnosis not present

## 2020-08-09 DIAGNOSIS — F411 Generalized anxiety disorder: Secondary | ICD-10-CM | POA: Diagnosis not present

## 2020-08-09 DIAGNOSIS — Z630 Problems in relationship with spouse or partner: Secondary | ICD-10-CM | POA: Diagnosis not present

## 2020-08-18 ENCOUNTER — Telehealth: Payer: Self-pay | Admitting: Internal Medicine

## 2020-08-18 NOTE — Telephone Encounter (Signed)
Please refill as per office routine med refill policy (all routine meds refilled for 3 mo or monthly per pt preference up to one year from last visit, then month to month grace period for 3 mo, then further med refills will have to be denied)  

## 2020-08-18 NOTE — Telephone Encounter (Signed)
   1.Medication Requested: ALPRAZolam (XANAX) 1 MG tablet amphetamine-dextroamphetamine (ADDERALL) 20 MG tablet 2. Pharmacy (Name, Street, [place]): [phone] [place], [place] - [address] AT [address] OF [facility] [address] 3. On Med List: yes  4. Last Visit with PCP: 06/30/20  5. Next visit date with PCP: n/a   Agent: Please be advised that RX refills may take up to 3 business days. We ask that you follow-up with your pharmacy.

## 2020-08-19 DIAGNOSIS — F411 Generalized anxiety disorder: Secondary | ICD-10-CM | POA: Diagnosis not present

## 2020-08-19 DIAGNOSIS — Z630 Problems in relationship with spouse or partner: Secondary | ICD-10-CM | POA: Diagnosis not present

## 2020-08-19 MED ORDER — ALPRAZOLAM 1 MG PO TABS: ORAL_TABLET | ORAL | 2 refills | Status: DC

## 2020-08-19 MED ORDER — AMPHETAMINE-DEXTROAMPHETAMINE 20 MG PO TABS: ORAL_TABLET | ORAL | 0 refills | Status: DC

## 2020-08-19 NOTE — Telephone Encounter (Signed)
Done erx 

## 2020-08-19 NOTE — Telephone Encounter (Signed)
Sent to Dr. John. 

## 2020-08-19 NOTE — Addendum Note (Signed)
Addended by: Corwin Levins on: 08/19/2020 12:49 PM   Modules accepted: Orders

## 2020-08-22 DIAGNOSIS — Z630 Problems in relationship with spouse or partner: Secondary | ICD-10-CM | POA: Diagnosis not present

## 2020-08-22 DIAGNOSIS — F411 Generalized anxiety disorder: Secondary | ICD-10-CM | POA: Diagnosis not present

## 2020-08-29 DIAGNOSIS — Z630 Problems in relationship with spouse or partner: Secondary | ICD-10-CM | POA: Diagnosis not present

## 2020-08-29 DIAGNOSIS — F411 Generalized anxiety disorder: Secondary | ICD-10-CM | POA: Diagnosis not present

## 2020-09-02 DIAGNOSIS — Z630 Problems in relationship with spouse or partner: Secondary | ICD-10-CM | POA: Diagnosis not present

## 2020-09-02 DIAGNOSIS — F411 Generalized anxiety disorder: Secondary | ICD-10-CM | POA: Diagnosis not present

## 2020-09-05 DIAGNOSIS — Z630 Problems in relationship with spouse or partner: Secondary | ICD-10-CM | POA: Diagnosis not present

## 2020-09-05 DIAGNOSIS — F411 Generalized anxiety disorder: Secondary | ICD-10-CM | POA: Diagnosis not present

## 2020-09-09 DIAGNOSIS — F411 Generalized anxiety disorder: Secondary | ICD-10-CM | POA: Diagnosis not present

## 2020-09-09 DIAGNOSIS — Z630 Problems in relationship with spouse or partner: Secondary | ICD-10-CM | POA: Diagnosis not present

## 2020-09-12 DIAGNOSIS — Z630 Problems in relationship with spouse or partner: Secondary | ICD-10-CM | POA: Diagnosis not present

## 2020-09-12 DIAGNOSIS — F411 Generalized anxiety disorder: Secondary | ICD-10-CM | POA: Diagnosis not present

## 2020-09-13 ENCOUNTER — Other Ambulatory Visit: Payer: Self-pay | Admitting: Internal Medicine

## 2020-09-16 DIAGNOSIS — F411 Generalized anxiety disorder: Secondary | ICD-10-CM | POA: Diagnosis not present

## 2020-09-16 DIAGNOSIS — Z630 Problems in relationship with spouse or partner: Secondary | ICD-10-CM | POA: Diagnosis not present

## 2020-09-19 DIAGNOSIS — Z630 Problems in relationship with spouse or partner: Secondary | ICD-10-CM | POA: Diagnosis not present

## 2020-09-19 DIAGNOSIS — F411 Generalized anxiety disorder: Secondary | ICD-10-CM | POA: Diagnosis not present

## 2020-09-20 ENCOUNTER — Telehealth: Payer: Self-pay | Admitting: Internal Medicine

## 2020-09-20 DIAGNOSIS — F411 Generalized anxiety disorder: Secondary | ICD-10-CM | POA: Diagnosis not present

## 2020-09-20 DIAGNOSIS — Z630 Problems in relationship with spouse or partner: Secondary | ICD-10-CM | POA: Diagnosis not present

## 2020-09-20 NOTE — Telephone Encounter (Signed)
    1.Medication Requested: amphetamine-dextroamphetamine (ADDERALL) 20 MG tablet  2. Pharmacy (Name, Street, [phone] , [place] - [address] AT [address] OF [facility] [address]  3. On Med List: yes  4. Last Visit with PCP: 06/30/20  5. Next visit date with PCP: n/a   Agent: Please be advised that RX refills may take up to 3 business days. We ask that you follow-up with your pharmacy.

## 2020-09-21 MED ORDER — AMPHETAMINE-DEXTROAMPHETAMINE 20 MG PO TABS: ORAL_TABLET | ORAL | 0 refills | Status: DC

## 2020-09-21 NOTE — Telephone Encounter (Signed)
Sent to Dr. John. 

## 2020-09-21 NOTE — Telephone Encounter (Signed)
Done erx 

## 2020-09-24 DIAGNOSIS — R059 Cough, unspecified: Secondary | ICD-10-CM | POA: Diagnosis not present

## 2020-09-24 DIAGNOSIS — M791 Myalgia, unspecified site: Secondary | ICD-10-CM | POA: Diagnosis not present

## 2020-09-26 DIAGNOSIS — F411 Generalized anxiety disorder: Secondary | ICD-10-CM | POA: Diagnosis not present

## 2020-09-26 DIAGNOSIS — Z630 Problems in relationship with spouse or partner: Secondary | ICD-10-CM | POA: Diagnosis not present

## 2020-09-28 ENCOUNTER — Ambulatory Visit: Payer: BC Managed Care – PPO | Admitting: Internal Medicine

## 2020-09-30 DIAGNOSIS — Z630 Problems in relationship with spouse or partner: Secondary | ICD-10-CM | POA: Diagnosis not present

## 2020-09-30 DIAGNOSIS — F411 Generalized anxiety disorder: Secondary | ICD-10-CM | POA: Diagnosis not present

## 2020-10-03 DIAGNOSIS — Z630 Problems in relationship with spouse or partner: Secondary | ICD-10-CM | POA: Diagnosis not present

## 2020-10-03 DIAGNOSIS — F411 Generalized anxiety disorder: Secondary | ICD-10-CM | POA: Diagnosis not present

## 2020-10-04 ENCOUNTER — Ambulatory Visit: Payer: BC Managed Care – PPO | Admitting: Internal Medicine

## 2020-10-04 ENCOUNTER — Emergency Department (HOSPITAL_COMMUNITY)
Admission: EM | Admit: 2020-10-04 | Discharge: 2020-10-04 | Disposition: A | Discharge status: Home / Self Care | Payer: BC Managed Care – PPO | Attending: Emergency Medicine | Admitting: Emergency Medicine

## 2020-10-04 ENCOUNTER — Emergency Department (HOSPITAL_COMMUNITY): Payer: BC Managed Care – PPO

## 2020-10-04 ENCOUNTER — Telehealth: Payer: BC Managed Care – PPO | Admitting: Family Medicine

## 2020-10-04 ENCOUNTER — Other Ambulatory Visit: Payer: Self-pay

## 2020-10-04 ENCOUNTER — Encounter (HOSPITAL_COMMUNITY): Payer: Self-pay | Admitting: Emergency Medicine

## 2020-10-04 DIAGNOSIS — R079 Chest pain, unspecified: Secondary | ICD-10-CM

## 2020-10-04 DIAGNOSIS — R059 Cough, unspecified: Secondary | ICD-10-CM | POA: Diagnosis not present

## 2020-10-04 DIAGNOSIS — J189 Pneumonia, unspecified organism: Secondary | ICD-10-CM | POA: Insufficient documentation

## 2020-10-04 DIAGNOSIS — F17210 Nicotine dependence, cigarettes, uncomplicated: Secondary | ICD-10-CM | POA: Insufficient documentation

## 2020-10-04 DIAGNOSIS — Z20822 Contact with and (suspected) exposure to covid-19: Secondary | ICD-10-CM | POA: Insufficient documentation

## 2020-10-04 DIAGNOSIS — R0789 Other chest pain: Secondary | ICD-10-CM | POA: Diagnosis not present

## 2020-10-04 DIAGNOSIS — M25512 Pain in left shoulder: Secondary | ICD-10-CM | POA: Insufficient documentation

## 2020-10-04 LAB — TROPONIN I (HIGH SENSITIVITY)
Troponin I (High Sensitivity): <2 ng/L (ref <18)
Troponin I (High Sensitivity): 3 ng/L (ref <18)

## 2020-10-04 LAB — BASIC METABOLIC PANEL
Anion gap: 10 (ref 5–15)
BUN: 9 mg/dL (ref 6–20)
CO2: 25 mmol/L (ref 22–32)
Calcium: 8.7 mg/dL — ABNORMAL LOW (ref 8.9–10.3)
Chloride: 104 mmol/L (ref 98–111)
Creatinine, Ser: 0.73 mg/dL (ref 0.44–1.00)
GFR, Estimated: >60 mL/min (ref >60)
Glucose, Bld: 106 mg/dL — ABNORMAL HIGH (ref 70–99)
Potassium: 3.3 mmol/L — ABNORMAL LOW (ref 3.5–5.1)
Sodium: 139 mmol/L (ref 135–145)

## 2020-10-04 LAB — CBC
HCT: 36.8 % (ref 36.0–46.0)
Hemoglobin: 12 g/dL (ref 12.0–15.0)
MCH: 29.8 pg (ref 26.0–34.0)
MCHC: 32.6 g/dL (ref 30.0–36.0)
MCV: 91.3 fL (ref 80.0–100.0)
Platelets: 365 10*3/uL (ref 150–400)
RBC: 4.03 MIL/uL (ref 3.87–5.11)
RDW: 13.5 % (ref 11.5–15.5)
WBC: 11.1 10*3/uL — ABNORMAL HIGH (ref 4.0–10.5)
nRBC: 0 % (ref 0.0–0.2)

## 2020-10-04 LAB — RESPIRATORY PANEL BY RT PCR (FLU A&B, COVID)
Influenza A by PCR: NEGATIVE
Influenza B by PCR: NEGATIVE
SARS Coronavirus 2 by RT PCR: NEGATIVE

## 2020-10-04 LAB — D-DIMER, QUANTITATIVE: D-Dimer, Quant: 0.52 ug/mL-FEU — ABNORMAL HIGH (ref 0.00–0.50)

## 2020-10-04 LAB — I-STAT BETA HCG BLOOD, ED (MC, WL, AP ONLY): I-stat hCG, quantitative: <5 m[IU]/mL (ref <5)

## 2020-10-04 MED ORDER — GUAIFENESIN 100 MG/5ML PO SOLN
5.0000 mL | Freq: Once | ORAL | Status: AC
  Administered 2020-10-04: 100 mg via ORAL
  Filled 2020-10-04: qty 5

## 2020-10-04 MED ORDER — POTASSIUM CHLORIDE CRYS ER 20 MEQ PO TBCR
30.0000 meq | EXTENDED_RELEASE_TABLET | Freq: Once | ORAL | Status: AC
  Administered 2020-10-04: 30 meq via ORAL
  Filled 2020-10-04: qty 2

## 2020-10-04 MED ORDER — AMOXICILLIN-POT CLAVULANATE 875-125 MG PO TABS: 1.0000 | ORAL_TABLET | Freq: Two times a day (BID) | ORAL | 0 refills | Status: AC

## 2020-10-04 NOTE — Discharge Instructions (Signed)
I prescribed you an antibiotic called Augmentin.  You are going to take this twice a day for the next 7 days.  Please do not stop taking this early.  Please take it as prescribed.  You can continue taking over-the-counter medications for your cough such as Robitussin.  If you develop any new or worsening symptoms that we discussed which could be concerning for blood clot, you need to return to the ER immediately for reevaluation.  It was a pleasure to meet you.

## 2020-10-04 NOTE — ED Provider Notes (Signed)
[facility] EMERGENCY DEPARTMENT Provider Note   CSN: [id number] Arrival date & time: 10/04/20  0701     History Chief Complaint  Patient presents with  . Shoulder Pain  . Chest Pain    Gina Adams is a 29 y.o. female.  HPI   Patient is a 29 year old female with a history of endometriosis and MS who presents to the emergency department due to chest pain.  Patient states about 2 to 3 weeks ago she began experiencing a productive cough.  [facility] and was diagnosed with bronchitis and started on albuterol as well as prednisone.  She states that this provided mild relief of her symptoms.  She states that her cough has been productive and was producing green sputum but states that it recently turned white about 1 to 2 days ago.  She states that yesterday she began experiencing a sharp left-sided chest pain that radiates to her left shoulder.  It is worse with deep breathing and movement.  States her current pain has worsened to about a 4 or 5 out of 10.  No fevers, chills, abdominal pain, vomiting, urinary changes, syncope, hemoptysis, leg swelling, calf pain.     Past Medical History:  Diagnosis Date  . CIN I (cervical intraepithelial neoplasia I) 2012  . Endometriosis   . Multiple sclerosis (HCC) 03/26/2018  . Panic   . Pap smear of cervix with ASCUS, cannot exclude HGSIL 2012    Patient Active Problem List   Diagnosis Date Noted  . Back pain 06/30/2020  . Dysphagia 06/30/2020  . GERD (gastroesophageal reflux disease) 06/30/2020  . Right arm pain 06/30/2020  . Polyarthralgia 02/13/2020  . Right wrist pain 02/13/2020  . Allergic rhinitis 02/13/2020  . Sinus tachycardia 02/26/2019  . Chest pain 02/26/2019  . Panic attacks 02/26/2019  . Hyperglycemia 02/26/2019  . ADD (attention deficit disorder) 04/10/2018  . Multiple sclerosis (HCC) 03/26/2018  . Encounter for well adult exam with abnormal findings 03/15/2018  .  Anxiety with depression 03/14/2018  . Paresthesias 03/14/2018  . Smoker 12/21/2016  . Personal history of endometriosis 12/21/2016  . Female pelvic pain 12/21/2016    Past Surgical History:  Procedure Laterality Date  . DIAGNOSTIC LAPAROSCOPY    . DILATION AND EVACUATION N/A 08/08/2015   Procedure: DILATATION AND EVACUATION;  Surgeon: Zelphia Cairo, MD;  Location: [facility];  Service: Gynecology;  Laterality: N/A;  . TONSILLECTOMY    . WISDOM TOOTH EXTRACTION     x4     OB History    Gravida  2   Para  0   Term  0   Preterm  0   AB  1   Living  0     SAB  1   TAB  0   Ectopic  0   Multiple  0   Live Births              Family History  Problem Relation Age of Onset  . Ovarian cancer Mother   . Skin cancer Mother   . Anxiety disorder Brother   . Prostate cancer Maternal Grandfather     Social History   Tobacco Use  . Smoking status: Current Every Day Smoker    Packs/day: 0.50    Types: Cigarettes  . Smokeless tobacco: Never Used  Vaping Use  . Vaping Use: Former  Substance Use Topics  . Alcohol use: Yes    Comment: occ  . Drug  use: No    Comment: denies 09/23/2015 marijuana when pt was 15    Home Medications Prior to Admission medications   Medication Sig Start Date End Date Taking? Authorizing Provider  ALPRAZolam Prudy Feeler) 1 MG tablet TAKE 1/2 TO 1 TABLET daily AS NEEDED FOR PANIC 08/19/20   Corwin Levins, MD  amphetamine-dextroamphetamine (ADDERALL) 20 MG tablet 1 tab by mouth twice per day 09/21/20   Corwin Levins, MD  Azelastine-Fluticasone [phone] MCG/ACT SUSP 1 spray each side twice per day 07/20/20   Corwin Levins, MD  cyclobenzaprine (FLEXERIL) 5 MG tablet Take 1 tablet (5 mg total) by mouth 3 (three) times daily as needed for muscle spasms. 06/30/20   Corwin Levins, MD  gabapentin (NEURONTIN) 300 MG capsule Take 300 mg by mouth 3 (three) times daily. 2 tabs po TID    [provider]  ibuprofen (ADVIL) 200 MG tablet Take 200 mg by  mouth every 6 (six) hours as needed. Patient said she takes 2 tabs per day    [provider]  iron polysaccharides (NU-IRON) 150 MG capsule Take 1 capsule (150 mg total) by mouth daily. 02/13/20   Corwin Levins, MD  meloxicam (MOBIC) 15 MG tablet TAKE 1 TABLET BY MOUTH EVERY DAY AS NEEDED FOR PAIN 09/13/20   Corwin Levins, MD  minocycline (MINOCIN) 100 MG capsule Take 100 mg by mouth 2 (two) times daily. 06/28/20   [provider]  Ocrelizumab (OCREVUS IV)  02/10/19   [provider]  OIL BASE EX Apply topically. CBD Oil    [provider]  pantoprazole (PROTONIX) 40 MG tablet Take 1 tablet (40 mg total) by mouth daily. 06/30/20   Corwin Levins, MD    Allergies    Quetiapine  Review of Systems   Review of Systems  All other systems reviewed and are negative. Ten systems reviewed and are negative for acute change, except as noted in the HPI.    Physical Exam Updated Vital Signs BP (!) 140/92 (BP Location: Right Arm)   Pulse (!) 110   Temp 97.8 F (36.6 C) (Oral)   Resp 20   Ht 5\' 2"  (1.575 m)   Wt 64 kg   SpO2 100%   BMI 25.81 kg/m   Physical Exam Vitals and nursing note reviewed.  Constitutional:      General: She is not in acute distress.    Appearance: Normal appearance. She is not ill-appearing, toxic-appearing or diaphoretic.  HENT:     Head: Normocephalic and atraumatic.     Right Ear: External ear normal.     Left Ear: External ear normal.     Nose: Nose normal.     Mouth/Throat:     Mouth: Mucous membranes are moist.     Pharynx: Oropharynx is clear. No oropharyngeal exudate or posterior oropharyngeal erythema.  Eyes:     Extraocular Movements: Extraocular movements intact.  Cardiovascular:     Rate and Rhythm: Normal rate and regular rhythm.     Pulses: Normal pulses.          Radial pulses are 2+ on the right side and 2+ on the left side.       Dorsalis pedis pulses are 2+ on the right side and 2+ on the left side.     Heart  sounds: Normal heart sounds. No murmur heard.  No friction rub. No gallop.   Pulmonary:     Effort: Pulmonary effort is normal. No tachypnea, accessory  muscle usage or respiratory distress.     Breath sounds: Normal breath sounds. No stridor. No decreased breath sounds, wheezing, rhonchi or rales.     Comments: LCTAB.  Symmetrical rise and fall the chest with inspiration and expiration. Chest:     Chest wall: Tenderness present.     Comments: Very mild tenderness noted with palpation of the left anterior chest wall.  No crepitus. Abdominal:     General: Abdomen is flat.     Tenderness: There is no abdominal tenderness.  Musculoskeletal:        General: Normal range of motion.     Cervical back: Normal range of motion and neck supple. No tenderness.     Right lower leg: No tenderness. No edema.     Left lower leg: No edema.     Comments: No calf pain or swelling.  Skin:    General: Skin is warm and dry.  Neurological:     General: No focal deficit present.     Mental Status: She is alert and oriented to person, place, and time.  Psychiatric:        Mood and Affect: Mood normal.        Behavior: Behavior normal.    ED Results / Procedures / Treatments   Labs (all labs ordered are listed, but only abnormal results are displayed) Labs Reviewed  BASIC METABOLIC PANEL - Abnormal; Notable for the following components:      Result Value   Potassium 3.3 (*)    Glucose, Bld 106 (*)    Calcium 8.7 (*)    All other components within normal limits  CBC - Abnormal; Notable for the following components:   WBC 11.1 (*)    All other components within normal limits  D-DIMER, QUANTITATIVE (NOT AT [facility]) - Abnormal; Notable for the following components:   D-Dimer, Quant 0.52 (*)    All other components within normal limits  RESPIRATORY PANEL BY RT PCR (FLU A&B, COVID)  I-STAT BETA HCG BLOOD, ED ([facility], [facility], [facility] ONLY)  TROPONIN I (HIGH SENSITIVITY)  TROPONIN I (HIGH SENSITIVITY)    EKG None  Radiology DG Chest 2 View  Result Date: 10/04/2020 CLINICAL DATA:  Chest and shoulder region pain. Cough. Shortness of breath. EXAM: CHEST - 2 VIEW COMPARISON:  June 30, 2020 FINDINGS: There is airspace opacity in the inferior lingula. Lungs elsewhere are clear. Heart size and pulmonary vascularity are normal. No adenopathy. No bone lesions. IMPRESSION: Airspace opacity consistent with pneumonia in a portion of the inferior lingula. Lungs elsewhere clear. Heart size normal. No adenopathy. Electronically Signed   By: Bretta Bang III M.D.   On: 10/04/2020 07:49   Procedures Procedures (including critical care time)  Medications Ordered in ED Medications  guaiFENesin (ROBITUSSIN) 100 MG/5ML solution 100 mg (100 mg Oral Given 10/04/20 0919)  potassium chloride SA (KLOR-CON) CR tablet 30 mEq (30 mEq Oral Given 10/04/20 0920)   ED Course  I have reviewed the triage vital signs and the nursing notes.  Pertinent labs & imaging results that were available during my care of the patient were reviewed by me and considered in my medical decision making (see chart for details).  Clinical Course as of Oct 04 1212  Tue Oct 04, 2020  6811 Airspace opacity consistent with pneumonia in a portion of the inferior lingula. Lungs elsewhere clear. Heart size normal. No adenopathy.  DG Chest 2 View [LJ]  [phone] Given klor con  Potassium(!): 3.3 [LJ]  Clinical Course User Index [LJ] Placido Sou, PA-C   MDM Rules/Calculators/A&P                           Pt is a 29 y.o. female that presents with a history, physical exam, and ED Clinical Course as noted above.   Patient presents today due to 2 to 3 weeks of a ongoing cough as well as some left-sided chest pain that began yesterday.  Physical exam significant for palpable pain on the left side of the chest.  Otherwise physical exam reassuring.  Patient was mildly tachycardic when she first arrived but this is improved to the mid  nineties.  She is afebrile and not hypoxic.  Basic labs were obtained showing a mild leukocytosis of 11.1.  Mild hypokalemia of 3.3 which was repleted with Klor-Con.  Respiratory panel was negative.  No elevation in i-STAT beta-hCG.  Negative troponin x2.  Chest x-ray obtained showing an airspace opacity consistent with pneumonia in a portion of the inferior lingula.  This is consistent with the region that the patient is experiencing pain.  Given her initial tachycardia I obtained a D-dimer which was very mildly elevated at 0.52.  This was discussed with my attending physician Dr. Arby Barrette.  Given patient's chest x-ray findings did not feel that further imaging was necessary.  No recent travel.  No calf pain or swelling.  This was discussed with the patient and she is agreeable with not obtaining further imaging and is eager to be discharged.  Patient was given extremely strict return precautions and knows she needs to return to the ER if she develops any new or worsening symptoms.  We particularly discussed symptoms associated with DVT/PE.  We will discharge patient on a course of Augmentin for her likely pneumonia.  She is following up with her PCP regarding her symptoms later today.  Her questions were answered and she was amicable at the time of discharge.  Her vital signs are stable.   An After Visit Summary was printed and given to the patient.  Patient discharged to home/self care.  Condition at discharge: Stable  Note: Portions of this report may have been transcribed using voice recognition software. Every effort was made to ensure accuracy; however, inadvertent computerized transcription errors may be present.   Final Clinical Impression(s) / ED Diagnoses Final diagnoses:  Chest pain, unspecified type  Community acquired pneumonia, unspecified laterality   Rx / DC Orders ED Discharge Orders         Ordered    amoxicillin-clavulanate (AUGMENTIN) 875-125 MG tablet  2 times  daily        10/04/20 1027           Placido Sou, PA-C 10/04/20 1214    Arby Barrette, MD 10/05/20 [phone]

## 2020-10-04 NOTE — ED Notes (Signed)
Pt resting in bed. NADN 

## 2020-10-04 NOTE — ED Triage Notes (Signed)
Pt reports seeing her PCP and given meds for bronchitis. Last night, she began to have left shoulder pain. Reports a productive cough for 3 weeks and then suddenly last night she has not been able to cough. Negative home covid test. Endorses SOB

## 2020-10-04 NOTE — ED Notes (Signed)
Pt to ED room

## 2020-10-05 ENCOUNTER — Encounter: Payer: Self-pay | Admitting: Internal Medicine

## 2020-10-05 ENCOUNTER — Telehealth (INDEPENDENT_AMBULATORY_CARE_PROVIDER_SITE_OTHER): Payer: BC Managed Care – PPO | Admitting: Internal Medicine

## 2020-10-05 DIAGNOSIS — F17200 Nicotine dependence, unspecified, uncomplicated: Secondary | ICD-10-CM | POA: Diagnosis not present

## 2020-10-05 DIAGNOSIS — J189 Pneumonia, unspecified organism: Secondary | ICD-10-CM

## 2020-10-05 DIAGNOSIS — F418 Other specified anxiety disorders: Secondary | ICD-10-CM | POA: Diagnosis not present

## 2020-10-05 DIAGNOSIS — R739 Hyperglycemia, unspecified: Secondary | ICD-10-CM

## 2020-10-05 MED ORDER — HYDROCODONE-HOMATROPINE 5-1.5 MG/5ML PO SYRP: 5.0000 mL | ORAL_SOLUTION | Freq: Four times a day (QID) | ORAL | 0 refills | Status: AC | PRN

## 2020-10-05 MED ORDER — ALBUTEROL SULFATE HFA 108 (90 BASE) MCG/ACT IN AERS: 2.0000 | INHALATION_SPRAY | Freq: Four times a day (QID) | RESPIRATORY_TRACT | 3 refills | Status: DC | PRN

## 2020-10-05 MED ORDER — DM-GUAIFENESIN ER 30-600 MG PO TB12: 1.0000 | ORAL_TABLET | Freq: Two times a day (BID) | ORAL | 1 refills | Status: AC

## 2020-10-05 NOTE — Assessment & Plan Note (Signed)
stable overall by history and exam, recent data reviewed with pt, and pt to continue medical treatment as before,  to f/u any worsening symptoms or concerns  

## 2020-10-05 NOTE — Progress Notes (Signed)
Patient ID: Gina Adams, female   DOB: 1990-12-20, 29 y.o.   MRN: [id number]  Virtual Visit via Video Note  I connected with Gina Adams on 10/05/20 at  4:00 PM EDT by a video enabled telemedicine application and verified that I am speaking with the correct person using two identifiers.  Location of all participants today Patient: at home Provider: at office   I discussed the limitations of evaluation and management by telemedicine and the availability of in person appointments. The patient expressed understanding and agreed to proceed.  History of Present Illness:  Here to f/u after initially tx at [facility] for bronchitis with inhaler and steroid, but then has worsening left CP, seen in ED and found lingular PNA yesterday, already feeling improved after 1 day of augmentin but still significant prod cough without HA, ST, wheezing, worsening pain or sob, fevers or chills.  Does ask for inhaler refill and cough med. Still smoking, not ready to quit.  Also incidentally had one episode BRBPR at stool, without n/v, abd pain, rectal pain, diarrhea or constipation.  No prior hx, and no colonoscopy .   Past Medical History:  Diagnosis Date  . CIN I (cervical intraepithelial neoplasia I) 2012  . Endometriosis   . Multiple sclerosis (HCC) 03/26/2018  . Panic   . Pap smear of cervix with ASCUS, cannot exclude HGSIL 2012   Past Surgical History:  Procedure Laterality Date  . DIAGNOSTIC LAPAROSCOPY    . DILATION AND EVACUATION N/A 08/08/2015   Procedure: DILATATION AND EVACUATION;  Surgeon: Zelphia Cairo, MD;  Location: [facility];  Service: Gynecology;  Laterality: N/A;  . TONSILLECTOMY    . WISDOM TOOTH EXTRACTION     x4    reports that she has been smoking cigarettes. She has been smoking about 0.50 packs per day. She has never used smokeless tobacco. She reports current alcohol use. She reports that she does not use drugs. family history includes Anxiety disorder in her brother; Ovarian cancer  in her mother; Prostate cancer in her maternal grandfather; Skin cancer in her mother. Allergies  Allergen Reactions  . Quetiapine Other (See Comments) and Nausea And Vomiting    Felt like she couldn't get out of bed, felt very weak Felt like she couldn't get out of bed, felt very weak Felt like she couldn't get out of bed, felt very weak Felt like she couldn't get out of bed, felt very weak   Current Outpatient Medications on File Prior to Visit  Medication Sig Dispense Refill  . ALPRAZolam (XANAX) 1 MG tablet TAKE 1/2 TO 1 TABLET daily AS NEEDED FOR PANIC 30 tablet 2  . amoxicillin-clavulanate (AUGMENTIN) 875-125 MG tablet Take 1 tablet by mouth 2 (two) times daily for 7 days. 14 tablet 0  . amphetamine-dextroamphetamine (ADDERALL) 20 MG tablet 1 tab by mouth twice per day 60 tablet 0  . Azelastine-Fluticasone 137-50 MCG/ACT SUSP 1 spray each side twice per day 23 g 5  . cyclobenzaprine (FLEXERIL) 5 MG tablet Take 1 tablet (5 mg total) by mouth 3 (three) times daily as needed for muscle spasms. 30 tablet 1  . gabapentin (NEURONTIN) 300 MG capsule Take 300 mg by mouth 3 (three) times daily. 2 tabs po TID    . ibuprofen (ADVIL) 200 MG tablet Take 200 mg by mouth every 6 (six) hours as needed. Patient said she takes 2 tabs per day    . iron polysaccharides (NU-IRON) 150 MG capsule Take 1 capsule (150 mg total) by  mouth daily. 90 capsule 0  . meloxicam (MOBIC) 15 MG tablet TAKE 1 TABLET BY MOUTH EVERY DAY AS NEEDED FOR PAIN 30 tablet 0  . minocycline (MINOCIN) 100 MG capsule Take 100 mg by mouth 2 (two) times daily.    [employer] (OCREVUS IV)     . OIL BASE EX Apply topically. CBD Oil    . pantoprazole (PROTONIX) 40 MG tablet Take 1 tablet (40 mg total) by mouth daily. 90 tablet 3   No current facility-administered medications on file prior to visit.    Observations/Objective: Alert, NAD, appropriate mood and affect, resps normal, cn 2-12 intact, moves all 4s, no visible rash or  swelling Lab Results  Component Value Date   WBC 11.1 (H) 10/04/2020   HGB 12.0 10/04/2020   HCT 36.8 10/04/2020   PLT 365 10/04/2020   GLUCOSE 106 (H) 10/04/2020   CHOL 159 02/12/2020   TRIG 62.0 02/12/2020   HDL 62.00 02/12/2020   LDLCALC 84 02/12/2020   ALT 9 02/12/2020   AST 13 02/12/2020   NA 139 10/04/2020   K 3.3 (L) 10/04/2020   CL 104 10/04/2020   CREATININE 0.73 10/04/2020   BUN 9 10/04/2020   CO2 25 10/04/2020   TSH 0.88 02/12/2020   HGBA1C 5.6 02/12/2020   Assessment and Plan: See notes  Follow Up Instructions: See notes   I discussed the assessment and treatment plan with the patient. The patient was provided an opportunity to ask questions and all were answered. The patient agreed with the plan and demonstrated an understanding of the instructions.   The patient was advised to call back or seek an in-person evaluation if the symptoms worsen or if the condition fails to improve as anticipated.  Oliver Barre, MD

## 2020-10-05 NOTE — Progress Notes (Signed)
Subjective:    Patient ID: Gina Adams, female    DOB: [birth year], 29 y.o.   MRN: [id number]  HPI   Here to f/u after initially tx at [facility] for bronchitis with inhaler and steroid, but then has worsening left CP, seen in ED and found lingular PNA yesterday, already feeling improved after 1 day of augmentin but still significant prod cough without HA, ST, wheezing, worsening pain or sob, fevers or chills.  Does ask for inhaler refill and cough med. Still smoking, not ready to quit.  Also incidentally had one episode BRBPR at stool, without n/v, abd pain, rectal pain, diarrhea or constipation.  No prior hx, and no colonoscopy .   Past Medical History:  Diagnosis Date  . CIN I (cervical intraepithelial neoplasia I) 2012  . Endometriosis   . Multiple sclerosis (HCC) 03/26/2018  . Panic   . Pap smear of cervix with ASCUS, cannot exclude HGSIL 2012   Past Surgical History:  Procedure Laterality Date  . DIAGNOSTIC LAPAROSCOPY    . DILATION AND EVACUATION N/A 08/08/2015   Procedure: DILATATION AND EVACUATION;  Surgeon: Zelphia Cairo, MD;  Location: [facility];  Service: Gynecology;  Laterality: N/A;  . TONSILLECTOMY    . WISDOM TOOTH EXTRACTION     x4    reports that she has been smoking cigarettes. She has been smoking about 0.50 packs per day. She has never used smokeless tobacco. She reports current alcohol use. She reports that she does not use drugs. family history includes Anxiety disorder in her brother; Ovarian cancer in her mother; Prostate cancer in her maternal grandfather; Skin cancer in her mother. Allergies  Allergen Reactions  . Quetiapine Other (See Comments) and Nausea And Vomiting    Felt like she couldn't get out of bed, felt very weak Felt like she couldn't get out of bed, felt very weak Felt like she couldn't get out of bed, felt very weak Felt like she couldn't get out of bed, felt very weak   Current Outpatient Medications on File Prior to Visit  Medication Sig  Dispense Refill  . ALPRAZolam (XANAX) 1 MG tablet TAKE 1/2 TO 1 TABLET daily AS NEEDED FOR PANIC 30 tablet 2  . amoxicillin-clavulanate (AUGMENTIN) 875-125 MG tablet Take 1 tablet by mouth 2 (two) times daily for 7 days. 14 tablet 0  . amphetamine-dextroamphetamine (ADDERALL) 20 MG tablet 1 tab by mouth twice per day 60 tablet 0  . Azelastine-Fluticasone 137-50 MCG/ACT SUSP 1 spray each side twice per day 23 g 5  . cyclobenzaprine (FLEXERIL) 5 MG tablet Take 1 tablet (5 mg total) by mouth 3 (three) times daily as needed for muscle spasms. 30 tablet 1  . gabapentin (NEURONTIN) 300 MG capsule Take 300 mg by mouth 3 (three) times daily. 2 tabs po TID    . ibuprofen (ADVIL) 200 MG tablet Take 200 mg by mouth every 6 (six) hours as needed. Patient said she takes 2 tabs per day    . iron polysaccharides (NU-IRON) 150 MG capsule Take 1 capsule (150 mg total) by mouth daily. 90 capsule 0  . meloxicam (MOBIC) 15 MG tablet TAKE 1 TABLET BY MOUTH EVERY DAY AS NEEDED FOR PAIN 30 tablet 0  . minocycline (MINOCIN) 100 MG capsule Take 100 mg by mouth 2 (two) times daily.    [employer] (OCREVUS IV)     . OIL BASE EX Apply topically. CBD Oil    . pantoprazole (PROTONIX) 40 MG tablet Take 1 tablet (40  mg total) by mouth daily. 90 tablet 3   No current facility-administered medications on file prior to visit.   Review of Systems All otherwise neg per pt    Objective:   Physical Exam There were no vitals taken for this visit.        Assessment & Plan:

## 2020-10-05 NOTE — Assessment & Plan Note (Signed)
Encourage to quit smoking 

## 2020-10-05 NOTE — Assessment & Plan Note (Addendum)
Some early improvement, to add inhaler prn, cough med prn, and mucinex dm  I spent 31 minutes in preparing to see the patient by review of recent labs, imaging and procedures, obtaining and reviewing separately obtained history, communicating with the patient and family or caregiver, ordering medications, tests or procedures, and documenting clinical information in the EHR including the differential Dx, treatment, and any further evaluation and other management of cap, hyperglycemia, anxiety, smoker

## 2020-10-05 NOTE — Patient Instructions (Signed)
Please take all new medication as prescribed 

## 2020-10-07 DIAGNOSIS — F411 Generalized anxiety disorder: Secondary | ICD-10-CM | POA: Diagnosis not present

## 2020-10-07 DIAGNOSIS — Z630 Problems in relationship with spouse or partner: Secondary | ICD-10-CM | POA: Diagnosis not present

## 2020-10-10 ENCOUNTER — Telehealth: Payer: Self-pay | Admitting: Internal Medicine

## 2020-10-10 DIAGNOSIS — F411 Generalized anxiety disorder: Secondary | ICD-10-CM | POA: Diagnosis not present

## 2020-10-10 DIAGNOSIS — Z630 Problems in relationship with spouse or partner: Secondary | ICD-10-CM | POA: Diagnosis not present

## 2020-10-10 NOTE — Telephone Encounter (Signed)
ALPRAZolam (XANAX) 1 MG tablet [id number] - [place], [place] - [address] AT [facility] [address] Phone:  [phone]  Fax:  [phone]     Requesting a refill  Last seen- 10.27.21

## 2020-10-11 NOTE — Telephone Encounter (Signed)
Sorry - too soon, as sept 2021 rx was for 3 mo total

## 2020-10-11 NOTE — Telephone Encounter (Signed)
Sent to Dr. John. 

## 2020-10-13 ENCOUNTER — Encounter: Payer: Self-pay | Admitting: Family Medicine

## 2020-10-13 ENCOUNTER — Telehealth (INDEPENDENT_AMBULATORY_CARE_PROVIDER_SITE_OTHER): Payer: BC Managed Care – PPO | Admitting: Family Medicine

## 2020-10-13 DIAGNOSIS — J189 Pneumonia, unspecified organism: Secondary | ICD-10-CM

## 2020-10-13 DIAGNOSIS — R059 Cough, unspecified: Secondary | ICD-10-CM

## 2020-10-13 MED ORDER — AMOXICILLIN-POT CLAVULANATE 875-125 MG PO TABS: 1.0000 | ORAL_TABLET | Freq: Two times a day (BID) | ORAL | 0 refills | Status: AC

## 2020-10-13 MED ORDER — DOXYCYCLINE HYCLATE 100 MG PO TABS: 100.0000 mg | ORAL_TABLET | Freq: Two times a day (BID) | ORAL | 0 refills | Status: DC

## 2020-10-13 NOTE — Patient Instructions (Addendum)
-  I sent the medication(s) we discussed to your pharmacy: Meds ordered this encounter  Medications  . amoxicillin-clavulanate (AUGMENTIN) 875-125 MG tablet    Sig: Take 1 tablet by mouth 2 (two) times daily.    Dispense:  10 tablet    Refill:  0   Follow-up with your primary care doctor next week.    I hope you are feeling better soon!  Seek in person care promptly if your symptoms worsen, new concerns arise or you are not improving with treatment.  It was nice to meet you today. I help Aubrey out with telemedicine visits on Tuesdays and Thursdays and am available for visits on those days. If you have any concerns or questions following this visit please schedule a follow up visit with your Primary Care doctor or seek care at a local urgent care clinic to avoid delays in care.

## 2020-10-13 NOTE — Progress Notes (Signed)
Virtual Visit via Video Note  I connected with Gina Adams  on 10/13/20 at  1:20 PM EDT by a video enabled telemedicine application and verified that I am speaking with the correct person using two identifiers.  Location patient: home, [place] Location provider:work or home office Persons participating in the virtual visit: patient, provider, hairdresser  I discussed the limitations of evaluation and management by telemedicine and the availability of in person appointments. The patient expressed understanding and agreed to proceed.   HPI:  Acute telemedicine visit for Cough: -Onset:had pneumonia diagnosed 10/26 at an ER visit - covid and viral panel negative, reports also had a neg home covid test - reports treated with Augmentin x 7 days and steroids -she had follow up with PCP last week - also using alb as needed and cough medication -Symptoms include: started feeling better on abx but still with cough productive of green mucus, chest congestion, some fatigue and reports "feels like the antibiotic did not quite get it"; wants to take another course as reports "knows her body" and is immunosuppressed due to MS, feels like got a little worse since finished abx -no fevers, CP, NVD, inability to get out of bed -Pertinent past medical history:MS and reports is on immunosuppressive drugs -COVID-19 vaccine status: fully vaccinated  ROS: See pertinent positives and negatives per HPI.  Past Medical History:  Diagnosis Date  . CIN I (cervical intraepithelial neoplasia I) 2012  . Endometriosis   . Multiple sclerosis (HCC) 03/26/2018  . Panic   . Pap smear of cervix with ASCUS, cannot exclude HGSIL 2012    Past Surgical History:  Procedure Laterality Date  . DIAGNOSTIC LAPAROSCOPY    . DILATION AND EVACUATION N/A 08/08/2015   Procedure: DILATATION AND EVACUATION;  Surgeon: Zelphia Cairo, MD;  Location: [facility];  Service: Gynecology;  Laterality: N/A;  . TONSILLECTOMY    . WISDOM TOOTH EXTRACTION      x4     Current Outpatient Medications:  .  albuterol (VENTOLIN HFA) 108 (90 Base) MCG/ACT inhaler, Inhale 2 puffs into the lungs every 6 (six) hours as needed for wheezing or shortness of breath., Disp: 8 g, Rfl: 3 .  ALPRAZolam (XANAX) 1 MG tablet, TAKE 1/2 TO 1 TABLET daily AS NEEDED FOR PANIC, Disp: 30 tablet, Rfl: 2 .  amphetamine-dextroamphetamine (ADDERALL) 20 MG tablet, 1 tab by mouth twice per day, Disp: 60 tablet, Rfl: 0 .  Azelastine-Fluticasone 137-50 MCG/ACT SUSP, 1 spray each side twice per day, Disp: 23 g, Rfl: 5 .  cyclobenzaprine (FLEXERIL) 5 MG tablet, Take 1 tablet (5 mg total) by mouth 3 (three) times daily as needed for muscle spasms., Disp: 30 tablet, Rfl: 1 .  dextromethorphan-guaiFENesin (MUCINEX DM) 30-600 MG 12hr tablet, Take 1 tablet by mouth 2 (two) times daily., Disp: 60 tablet, Rfl: 1 .  gabapentin (NEURONTIN) 300 MG capsule, Take 300 mg by mouth 3 (three) times daily. 2 tabs po TID, Disp: , Rfl:  .  HYDROcodone-homatropine (HYCODAN) 5-1.5 MG/5ML syrup, Take 5 mLs by mouth every 6 (six) hours as needed for up to 10 days for cough., Disp: 180 mL, Rfl: 0 .  ibuprofen (ADVIL) 200 MG tablet, Take 200 mg by mouth every 6 (six) hours as needed. Patient said she takes 2 tabs per day, Disp: , Rfl:  .  iron polysaccharides (NU-IRON) 150 MG capsule, Take 1 capsule (150 mg total) by mouth daily., Disp: 90 capsule, Rfl: 0 .  meloxicam (MOBIC) 15 MG tablet, TAKE 1 TABLET BY  MOUTH EVERY DAY AS NEEDED FOR PAIN, Disp: 30 tablet, Rfl: 0 .  minocycline (MINOCIN) 100 MG capsule, Take 100 mg by mouth 2 (two) times daily., Disp: , Rfl:  .  Ocrelizumab (OCREVUS IV), , Disp: , Rfl:  .  OIL BASE EX, Apply topically. CBD Oil, Disp: , Rfl:  .  pantoprazole (PROTONIX) 40 MG tablet, Take 1 tablet (40 mg total) by mouth daily., Disp: 90 tablet, Rfl: 3  EXAM:  VITALS per patient if applicable:  GENERAL: alert, oriented, appears well and in no acute distress  HEENT: atraumatic,  conjunttiva clear, no obvious abnormalities on inspection of external nose and ears  NECK: normal movements of the head and neck  LUNGS: on inspection no signs of respiratory distress, breathing rate appears normal, no obvious gross SOB, gasping or wheezing  CV: no obvious cyanosis  MS: moves all visible extremities without noticeable abnormality  PSYCH/NEURO: pleasant and cooperative, no obvious depression or anxiety, speech and thought processing grossly intact  ASSESSMENT AND PLAN:  Discussed the following assessment and plan:  Community acquired pneumonia, unspecified laterality  -we discussed possible serious and likely etiologies, options for evaluation and workup, limitations of telemedicine visit vs in person visit, treatment, treatment risks and precautions. Pt prefers to treat via telemedicine empirically rather than in person at this moment.  Discussed potential etiologies of her residual symptoms, including residual inflammation and congestion versus partially treated pneumonia versus other.  She feels that she still has an infection and is adamant about taking another round of antibiotic.  During our discussion, found out that she is taking a tetracycline daily for her acne.  This combination could indeed potentially lessen the efficacy of the antibiotics in the lungs.  Discussed options in terms of trying a different antibiotic versus a longer course of the Augmentin.  She opted for a longer course of the Augmentin, since it seem to be helping and was tolerated well.  Sent in Augmentin 875 twice daily x5 days.  Recommended close follow-up with her primary care doctor next week  or an in person evaluation if any worsening or if symptoms are not resolved with the treatment.  She can continue the albuterol as needed per instructions and the cough medication from her primary care doctor.  Discussed a healthy diet, avoidance of dairy, plenty of water to stay hydrated.  Scheduled follow  up with PCP offered: Advised to follow-up with her primary care doctor next week, she agrees to call to schedule.  Advised to seek prompt in person care if worsening, new symptoms arise, or if is not improving with treatment. Discussed options for inperson care if PCP office not available. Did let this patient know that I only do telemedicine on Tuesdays and Thursdays for [place]. Advised to schedule follow up visit with PCP or [facility] if any further questions or concerns to avoid delays in care.   I discussed the assessment and treatment plan with the patient.  Over 30 minutes was spent on this appointment.  The patient was provided an opportunity to ask questions and all were answered. The patient agreed with the plan and demonstrated an understanding of the instructions.     Terressa Koyanagi, DO

## 2020-10-14 DIAGNOSIS — F411 Generalized anxiety disorder: Secondary | ICD-10-CM | POA: Diagnosis not present

## 2020-10-14 DIAGNOSIS — Z630 Problems in relationship with spouse or partner: Secondary | ICD-10-CM | POA: Diagnosis not present

## 2020-10-17 ENCOUNTER — Encounter: Payer: Self-pay | Admitting: Internal Medicine

## 2020-10-17 DIAGNOSIS — F411 Generalized anxiety disorder: Secondary | ICD-10-CM | POA: Diagnosis not present

## 2020-10-17 DIAGNOSIS — Z630 Problems in relationship with spouse or partner: Secondary | ICD-10-CM | POA: Diagnosis not present

## 2020-10-18 ENCOUNTER — Other Ambulatory Visit: Payer: Self-pay | Admitting: Internal Medicine

## 2020-10-18 DIAGNOSIS — R21 Rash and other nonspecific skin eruption: Secondary | ICD-10-CM

## 2020-10-21 DIAGNOSIS — F411 Generalized anxiety disorder: Secondary | ICD-10-CM | POA: Diagnosis not present

## 2020-10-21 DIAGNOSIS — Z630 Problems in relationship with spouse or partner: Secondary | ICD-10-CM | POA: Diagnosis not present

## 2020-10-24 DIAGNOSIS — F411 Generalized anxiety disorder: Secondary | ICD-10-CM | POA: Diagnosis not present

## 2020-10-24 DIAGNOSIS — Z630 Problems in relationship with spouse or partner: Secondary | ICD-10-CM | POA: Diagnosis not present

## 2020-10-31 DIAGNOSIS — F411 Generalized anxiety disorder: Secondary | ICD-10-CM | POA: Diagnosis not present

## 2020-10-31 DIAGNOSIS — Z630 Problems in relationship with spouse or partner: Secondary | ICD-10-CM | POA: Diagnosis not present

## 2020-11-07 ENCOUNTER — Telehealth: Payer: Self-pay | Admitting: Internal Medicine

## 2020-11-07 DIAGNOSIS — Z630 Problems in relationship with spouse or partner: Secondary | ICD-10-CM | POA: Diagnosis not present

## 2020-11-07 DIAGNOSIS — F411 Generalized anxiety disorder: Secondary | ICD-10-CM | POA: Diagnosis not present

## 2020-11-07 NOTE — Telephone Encounter (Signed)
amphetamine-dextroamphetamine (ADDERALL) 20 MG tablet [id number] , [place] - [address] AT [facility] [address] Phone:  [phone]  Fax:  [phone]     Requesting a refill Last seen- 10.27.21 Next apt- N/A

## 2020-11-09 ENCOUNTER — Other Ambulatory Visit: Payer: Self-pay | Admitting: Internal Medicine

## 2020-11-09 MED ORDER — AMPHETAMINE-DEXTROAMPHETAMINE 20 MG PO TABS: ORAL_TABLET | ORAL | 0 refills | Status: DC

## 2020-11-09 NOTE — Telephone Encounter (Signed)
Sent to Dr. John. 

## 2020-11-11 DIAGNOSIS — F411 Generalized anxiety disorder: Secondary | ICD-10-CM | POA: Diagnosis not present

## 2020-11-11 DIAGNOSIS — Z630 Problems in relationship with spouse or partner: Secondary | ICD-10-CM | POA: Diagnosis not present

## 2020-11-14 DIAGNOSIS — F411 Generalized anxiety disorder: Secondary | ICD-10-CM | POA: Diagnosis not present

## 2020-11-14 DIAGNOSIS — Z630 Problems in relationship with spouse or partner: Secondary | ICD-10-CM | POA: Diagnosis not present

## 2020-11-21 DIAGNOSIS — Z630 Problems in relationship with spouse or partner: Secondary | ICD-10-CM | POA: Diagnosis not present

## 2020-11-21 DIAGNOSIS — F411 Generalized anxiety disorder: Secondary | ICD-10-CM | POA: Diagnosis not present

## 2020-11-23 ENCOUNTER — Ambulatory Visit: Payer: BC Managed Care – PPO | Admitting: Internal Medicine

## 2020-11-23 DIAGNOSIS — Z0289 Encounter for other administrative examinations: Secondary | ICD-10-CM

## 2020-11-25 DIAGNOSIS — F411 Generalized anxiety disorder: Secondary | ICD-10-CM | POA: Diagnosis not present

## 2020-11-25 DIAGNOSIS — Z630 Problems in relationship with spouse or partner: Secondary | ICD-10-CM | POA: Diagnosis not present

## 2020-11-29 DIAGNOSIS — F411 Generalized anxiety disorder: Secondary | ICD-10-CM | POA: Diagnosis not present

## 2020-11-29 DIAGNOSIS — Z630 Problems in relationship with spouse or partner: Secondary | ICD-10-CM | POA: Diagnosis not present

## 2020-12-05 DIAGNOSIS — Z630 Problems in relationship with spouse or partner: Secondary | ICD-10-CM | POA: Diagnosis not present

## 2020-12-05 DIAGNOSIS — F411 Generalized anxiety disorder: Secondary | ICD-10-CM | POA: Diagnosis not present

## 2020-12-08 ENCOUNTER — Other Ambulatory Visit: Payer: Self-pay | Admitting: Internal Medicine

## 2020-12-08 MED ORDER — AMPHETAMINE-DEXTROAMPHET ER 20 MG PO CP24: 20.0000 mg | ORAL_CAPSULE | Freq: Every day | ORAL | 0 refills | Status: DC

## 2020-12-08 NOTE — Telephone Encounter (Signed)
Ok to let pt know -   Ok to change the instant release to the Extended release - done erx  Let us know if this is not covered by insuranced, as it could be changed to Concerta once daily long acting or back to the instant release

## 2020-12-12 DIAGNOSIS — Z630 Problems in relationship with spouse or partner: Secondary | ICD-10-CM | POA: Diagnosis not present

## 2020-12-12 DIAGNOSIS — F411 Generalized anxiety disorder: Secondary | ICD-10-CM | POA: Diagnosis not present

## 2020-12-13 ENCOUNTER — Encounter: Payer: Self-pay | Admitting: Internal Medicine

## 2020-12-14 ENCOUNTER — Other Ambulatory Visit: Payer: Self-pay | Admitting: Internal Medicine

## 2020-12-14 MED ORDER — AMPHETAMINE-DEXTROAMPHET ER 20 MG PO CP24
20.0000 mg | ORAL_CAPSULE | Freq: Every day | ORAL | 0 refills | Status: DC
Start: 2020-12-14 — End: 2020-12-27

## 2020-12-16 DIAGNOSIS — Z79899 Other long term (current) drug therapy: Secondary | ICD-10-CM | POA: Diagnosis not present

## 2020-12-16 DIAGNOSIS — G35 Multiple sclerosis: Secondary | ICD-10-CM | POA: Diagnosis not present

## 2020-12-19 DIAGNOSIS — Z630 Problems in relationship with spouse or partner: Secondary | ICD-10-CM | POA: Diagnosis not present

## 2020-12-19 DIAGNOSIS — F411 Generalized anxiety disorder: Secondary | ICD-10-CM | POA: Diagnosis not present

## 2020-12-20 ENCOUNTER — Ambulatory Visit: Payer: BC Managed Care – PPO | Admitting: Internal Medicine

## 2020-12-20 DIAGNOSIS — Z0289 Encounter for other administrative examinations: Secondary | ICD-10-CM

## 2020-12-23 DIAGNOSIS — F411 Generalized anxiety disorder: Secondary | ICD-10-CM | POA: Diagnosis not present

## 2020-12-23 DIAGNOSIS — Z630 Problems in relationship with spouse or partner: Secondary | ICD-10-CM | POA: Diagnosis not present

## 2020-12-27 ENCOUNTER — Encounter: Payer: Self-pay | Admitting: Internal Medicine

## 2020-12-27 ENCOUNTER — Other Ambulatory Visit: Payer: Self-pay | Admitting: Internal Medicine

## 2020-12-27 DIAGNOSIS — U071 COVID-19: Secondary | ICD-10-CM

## 2020-12-27 MED ORDER — AMPHETAMINE-DEXTROAMPHETAMINE 20 MG PO TABS: 20.0000 mg | ORAL_TABLET | Freq: Two times a day (BID) | ORAL | 0 refills | Status: DC

## 2020-12-27 MED ORDER — AMPHETAMINE-DEXTROAMPHET ER 20 MG PO CP24
20.0000 mg | ORAL_CAPSULE | Freq: Two times a day (BID) | ORAL | 0 refills | Status: DC
Start: 2020-12-27 — End: 2021-03-01

## 2020-12-27 NOTE — Telephone Encounter (Signed)
I think should be corrected now

## 2021-01-02 ENCOUNTER — Other Ambulatory Visit: Payer: Self-pay | Admitting: Internal Medicine

## 2021-01-09 DIAGNOSIS — G35 Multiple sclerosis: Secondary | ICD-10-CM | POA: Diagnosis not present

## 2021-01-09 DIAGNOSIS — Z79899 Other long term (current) drug therapy: Secondary | ICD-10-CM | POA: Diagnosis not present

## 2021-01-09 DIAGNOSIS — F419 Anxiety disorder, unspecified: Secondary | ICD-10-CM | POA: Diagnosis not present

## 2021-01-09 MED ORDER — ALBUTEROL SULFATE HFA 108 (90 BASE) MCG/ACT IN AERS: 2.0000 | INHALATION_SPRAY | Freq: Four times a day (QID) | RESPIRATORY_TRACT | 3 refills | Status: AC | PRN

## 2021-01-16 DIAGNOSIS — L700 Acne vulgaris: Secondary | ICD-10-CM | POA: Diagnosis not present

## 2021-02-03 ENCOUNTER — Other Ambulatory Visit: Payer: Self-pay | Admitting: Internal Medicine

## 2021-02-03 NOTE — Telephone Encounter (Signed)
Done erx 

## 2021-02-07 ENCOUNTER — Encounter: Payer: Self-pay | Admitting: Internal Medicine

## 2021-02-08 DIAGNOSIS — G35 Multiple sclerosis: Secondary | ICD-10-CM | POA: Diagnosis not present

## 2021-02-10 DIAGNOSIS — Z23 Encounter for immunization: Secondary | ICD-10-CM | POA: Diagnosis not present

## 2021-02-10 DIAGNOSIS — G35 Multiple sclerosis: Secondary | ICD-10-CM | POA: Diagnosis not present

## 2021-02-14 DIAGNOSIS — F411 Generalized anxiety disorder: Secondary | ICD-10-CM | POA: Diagnosis not present

## 2021-02-20 DIAGNOSIS — F411 Generalized anxiety disorder: Secondary | ICD-10-CM | POA: Diagnosis not present

## 2021-02-20 DIAGNOSIS — Z630 Problems in relationship with spouse or partner: Secondary | ICD-10-CM | POA: Diagnosis not present

## 2021-02-23 DIAGNOSIS — F411 Generalized anxiety disorder: Secondary | ICD-10-CM | POA: Diagnosis not present

## 2021-02-27 DIAGNOSIS — Z630 Problems in relationship with spouse or partner: Secondary | ICD-10-CM | POA: Diagnosis not present

## 2021-02-27 DIAGNOSIS — F411 Generalized anxiety disorder: Secondary | ICD-10-CM | POA: Diagnosis not present

## 2021-02-28 ENCOUNTER — Telehealth: Payer: Self-pay | Admitting: Internal Medicine

## 2021-02-28 NOTE — Telephone Encounter (Signed)
Patient requesting refill amphetamine-dextroamphetamine (ADDERALL XR) 20 MG 24 hr capsule amphetamine-dextroamphetamine (ADDERALL) 20 MG tablet  Pharmacy [facility] [address], [place] - [address]

## 2021-03-01 ENCOUNTER — Other Ambulatory Visit: Payer: Self-pay | Admitting: Internal Medicine

## 2021-03-01 MED ORDER — AMPHETAMINE-DEXTROAMPHET ER 20 MG PO CP24
20.0000 mg | ORAL_CAPSULE | Freq: Two times a day (BID) | ORAL | 0 refills | Status: DC
Start: 2021-03-01 — End: 2021-03-03

## 2021-03-01 NOTE — Telephone Encounter (Signed)
Ok to contact pt -   I think a mistake was made recently where the adderal xr 20 qd was confused with her other adderal and changed to BID  The adderal (short acting) 20 bid should be changed to once daily then I think to make sure she does not get too much medication  I already sent the adderall XR at 1 tab bid  Please make sure she understand the adderal 20 (short acting) should be once daily, then I will do that one as well, thanks

## 2021-03-02 ENCOUNTER — Other Ambulatory Visit: Payer: Self-pay | Admitting: Internal Medicine

## 2021-03-02 NOTE — Telephone Encounter (Signed)
Patient called and pharmacy told her that her refill could not be done before the 28th of the month. She said that there was a mix up last time she did a refill. She is requesting the amphetamine-dextroamphetamine (ADDERALL XR) 20 MG 24 hr capsule  [id number] - [place], [place] - [address]

## 2021-03-02 NOTE — Telephone Encounter (Signed)
Patient notified on the medication update

## 2021-03-03 ENCOUNTER — Encounter: Payer: Self-pay | Admitting: Internal Medicine

## 2021-03-03 MED ORDER — AMPHETAMINE-DEXTROAMPHETAMINE 20 MG PO TABS
20.0000 mg | ORAL_TABLET | Freq: Two times a day (BID) | ORAL | 0 refills | Status: DC
Start: 2021-03-03 — End: 2021-03-03

## 2021-03-03 MED ORDER — AMPHETAMINE-DEXTROAMPHET ER 20 MG PO CP24: 20.0000 mg | ORAL_CAPSULE | Freq: Two times a day (BID) | ORAL | 0 refills | Status: AC

## 2021-03-03 NOTE — Telephone Encounter (Signed)
[facility] [address] [place], [place] - [address] Phone:  [phone]  Fax:  [phone]     [place] she would like it sent to this pharmacy

## 2021-03-03 NOTE — Addendum Note (Signed)
Addended by: Corwin Levins on: 03/03/2021 01:02 PM   Modules accepted: Orders

## 2021-03-03 NOTE — Telephone Encounter (Signed)
Patient is requesting that amphetamine-dextroamphetamine (ADDERALL XR) 20 MG 24 hr capsule be sent to   [facility] [address] [place], [place] - [address] Phone:  [phone]  Fax:  [phone]

## 2021-03-03 NOTE — Telephone Encounter (Signed)
Done erx 

## 2021-03-09 DIAGNOSIS — F411 Generalized anxiety disorder: Secondary | ICD-10-CM | POA: Diagnosis not present

## 2021-03-16 DIAGNOSIS — Z630 Problems in relationship with spouse or partner: Secondary | ICD-10-CM | POA: Diagnosis not present

## 2021-03-16 DIAGNOSIS — F411 Generalized anxiety disorder: Secondary | ICD-10-CM | POA: Diagnosis not present

## 2021-03-28 DIAGNOSIS — F900 Attention-deficit hyperactivity disorder, predominantly inattentive type: Secondary | ICD-10-CM | POA: Diagnosis not present

## 2021-03-28 DIAGNOSIS — F3281 Premenstrual dysphoric disorder: Secondary | ICD-10-CM | POA: Diagnosis not present

## 2021-03-28 DIAGNOSIS — F410 Panic disorder [episodic paroxysmal anxiety] without agoraphobia: Secondary | ICD-10-CM | POA: Diagnosis not present

## 2021-03-30 DIAGNOSIS — F411 Generalized anxiety disorder: Secondary | ICD-10-CM | POA: Diagnosis not present

## 2021-03-30 DIAGNOSIS — Z630 Problems in relationship with spouse or partner: Secondary | ICD-10-CM | POA: Diagnosis not present

## 2021-04-20 ENCOUNTER — Encounter: Payer: Self-pay | Admitting: Internal Medicine

## 2021-04-20 DIAGNOSIS — R1310 Dysphagia, unspecified: Secondary | ICD-10-CM

## 2021-04-20 DIAGNOSIS — K219 Gastro-esophageal reflux disease without esophagitis: Secondary | ICD-10-CM

## 2021-04-21 DIAGNOSIS — R6883 Chills (without fever): Secondary | ICD-10-CM | POA: Diagnosis not present

## 2021-04-21 DIAGNOSIS — R509 Fever, unspecified: Secondary | ICD-10-CM | POA: Diagnosis not present

## 2021-04-21 DIAGNOSIS — R059 Cough, unspecified: Secondary | ICD-10-CM | POA: Diagnosis not present

## 2021-04-21 DIAGNOSIS — J189 Pneumonia, unspecified organism: Secondary | ICD-10-CM | POA: Diagnosis not present

## 2021-04-26 ENCOUNTER — Encounter: Payer: Self-pay | Admitting: Gastroenterology

## 2021-04-28 ENCOUNTER — Ambulatory Visit: Payer: BC Managed Care – PPO | Admitting: Internal Medicine

## 2021-05-07 IMAGING — DX DG WRIST COMPLETE 3+V*R*
4 series · 4 of 4 positions shown · non-contrast
Comparison: None

CLINICAL DATA: Injury moving furniture 1 month ago, medial pain
since

EXAM:
RIGHT WRIST - COMPLETE 3+ VIEW

[wrist pa]
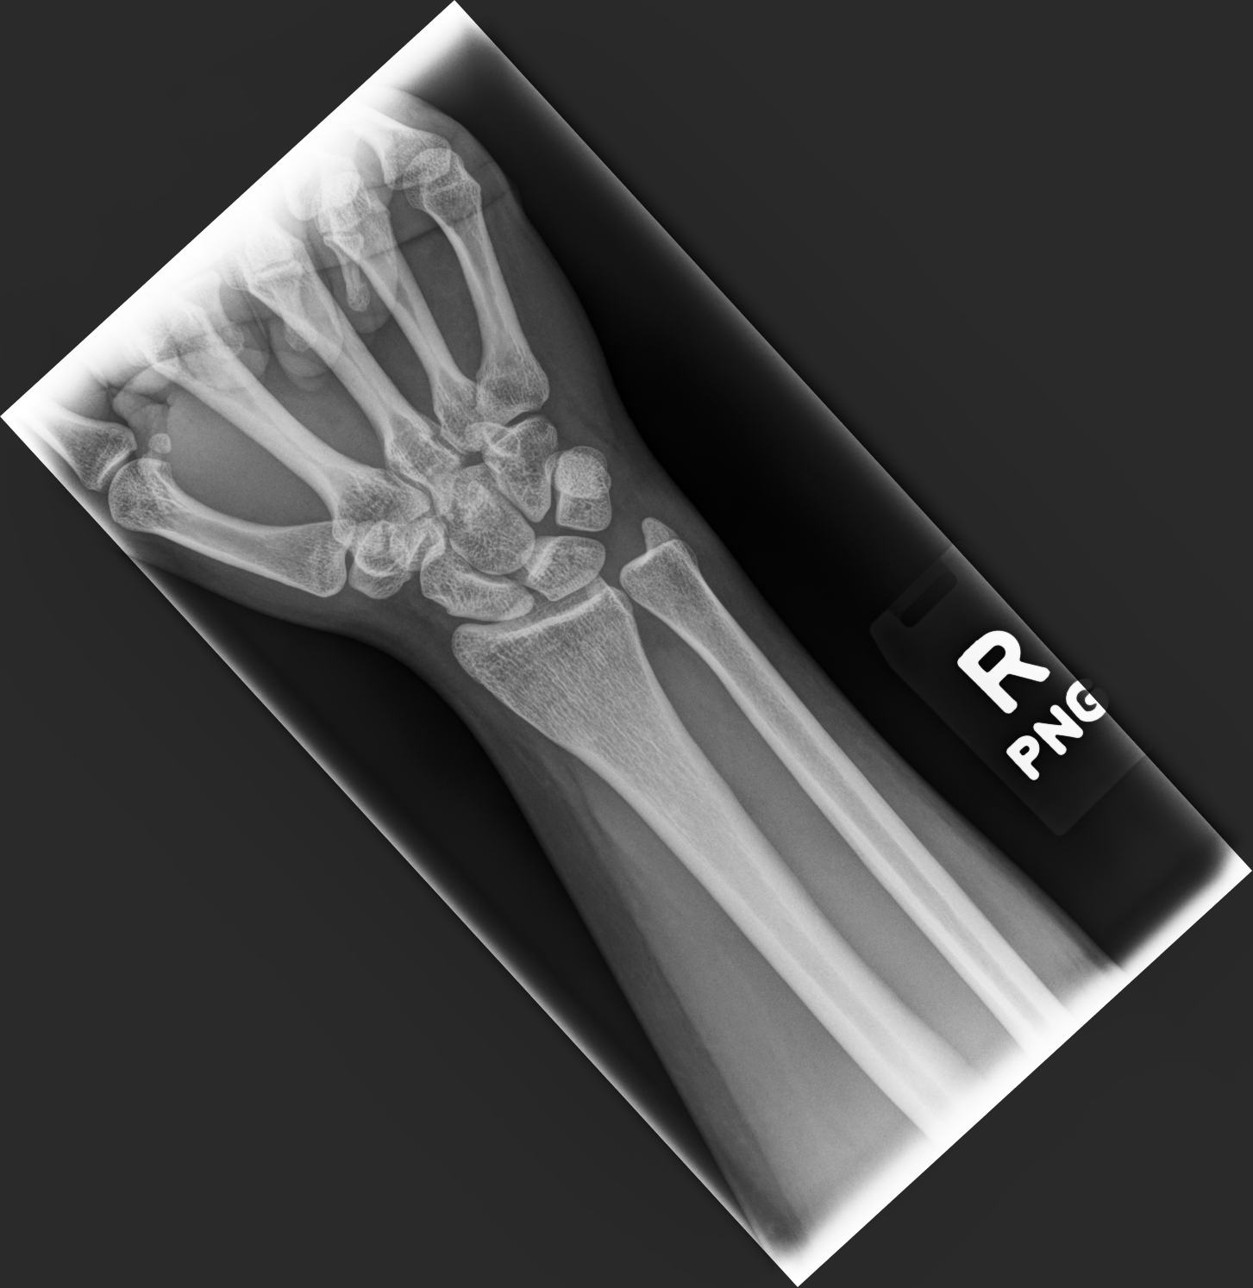

[wrist mlo]
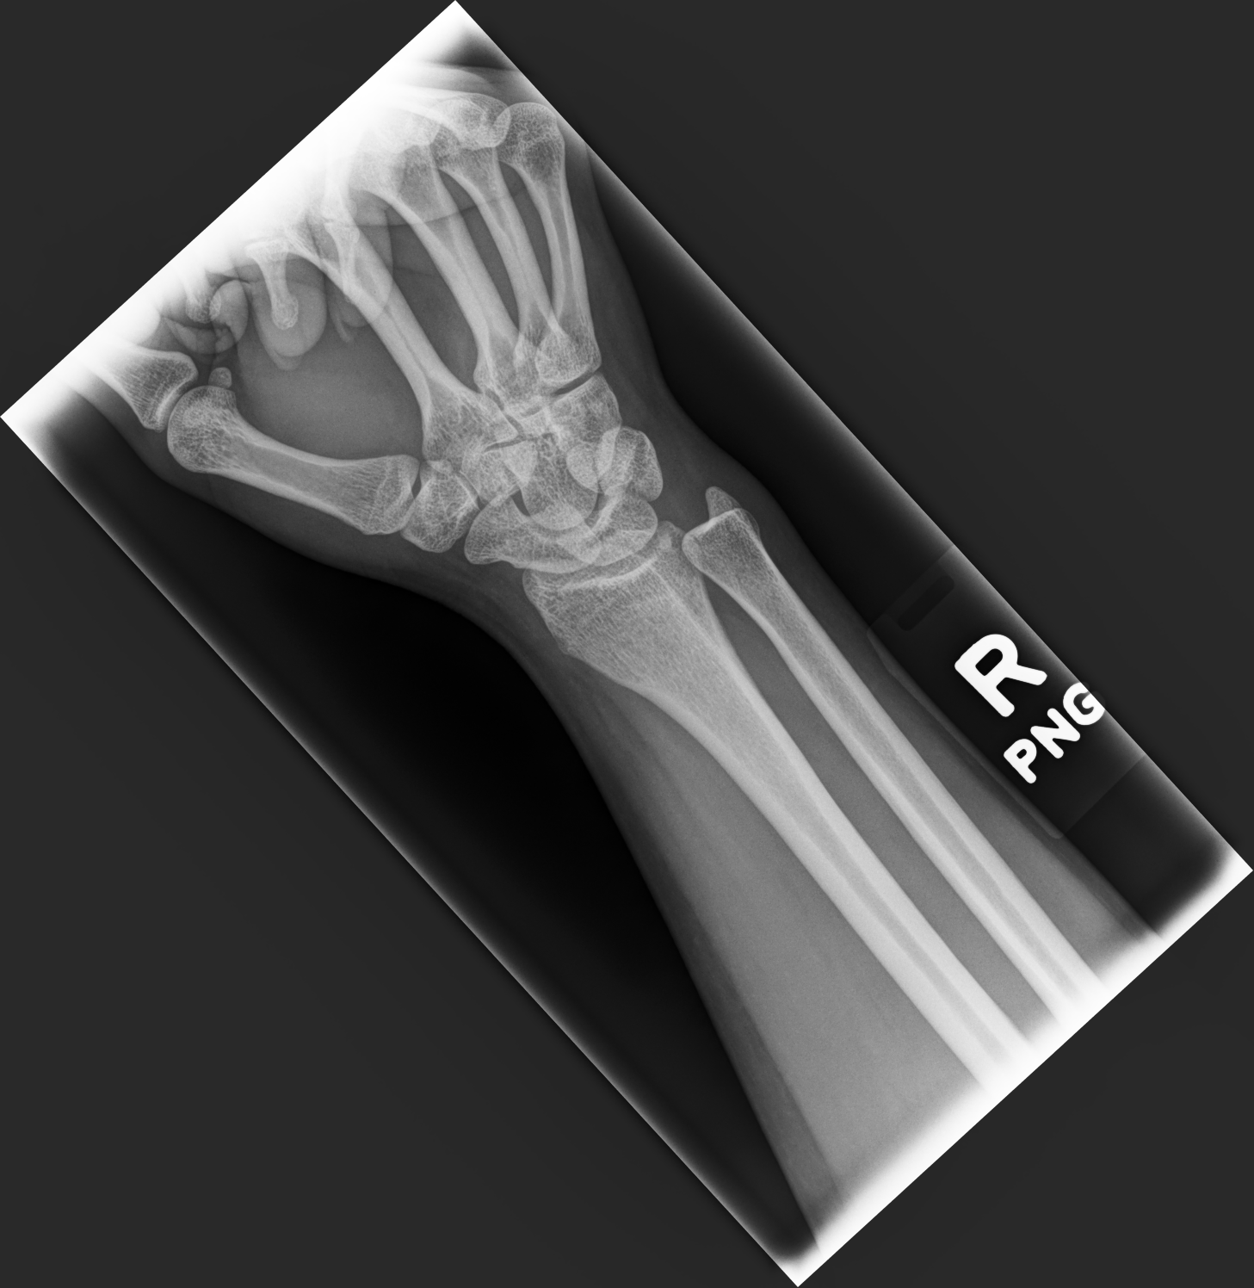

[wrist lat]
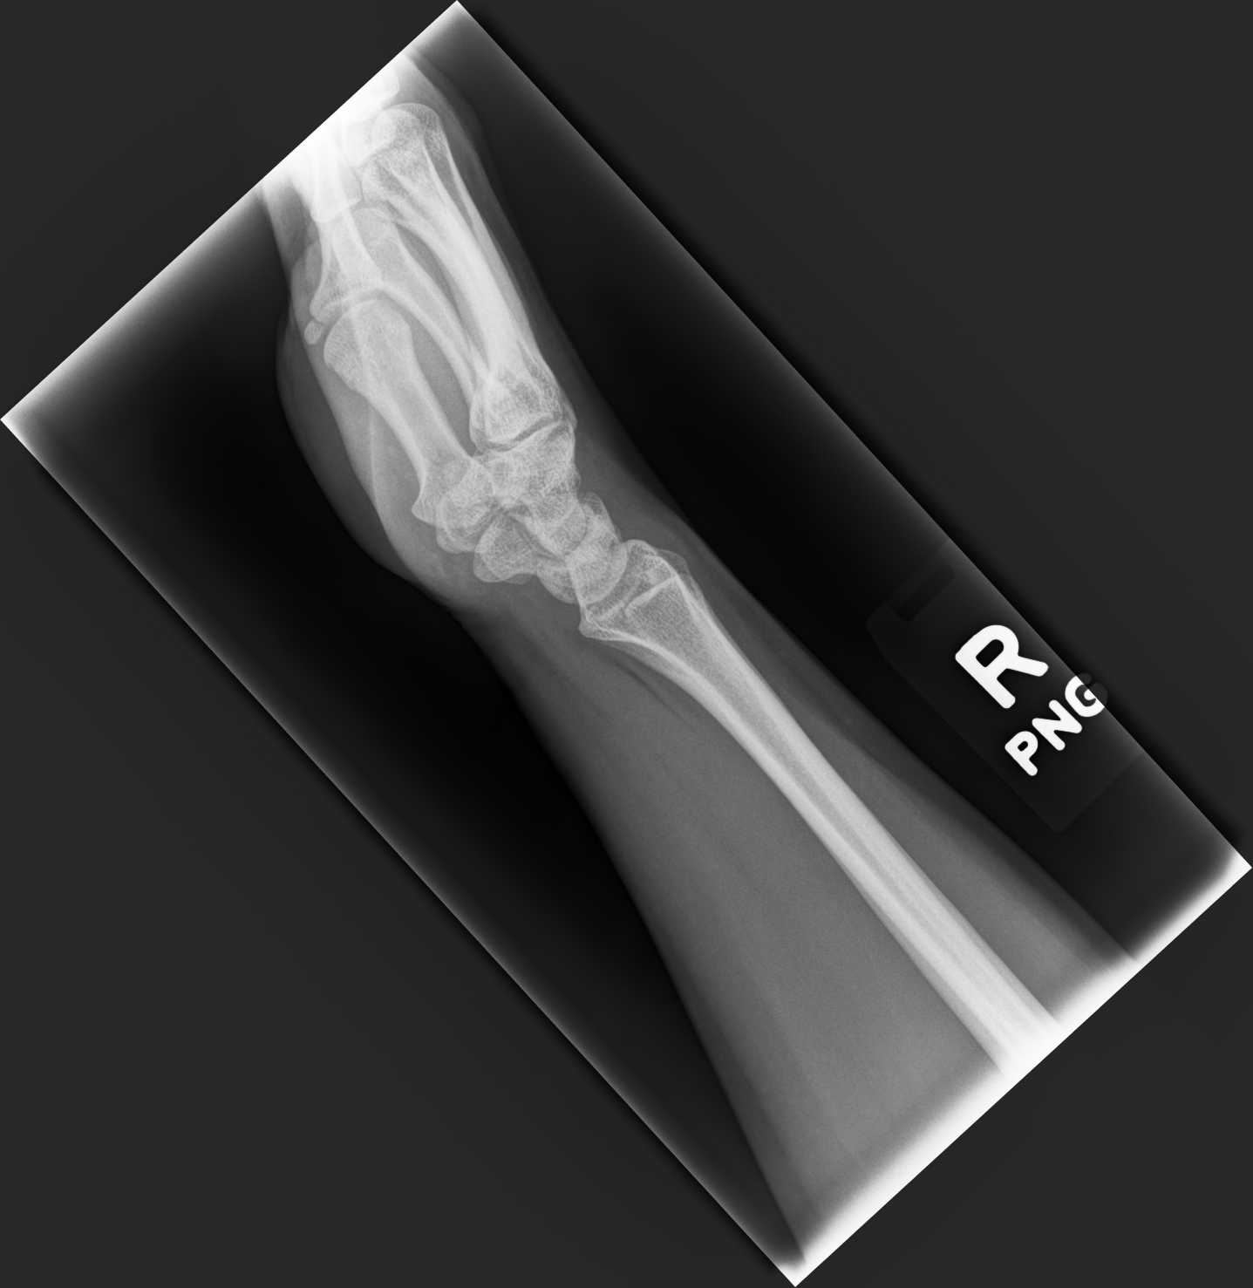

[hand pa]
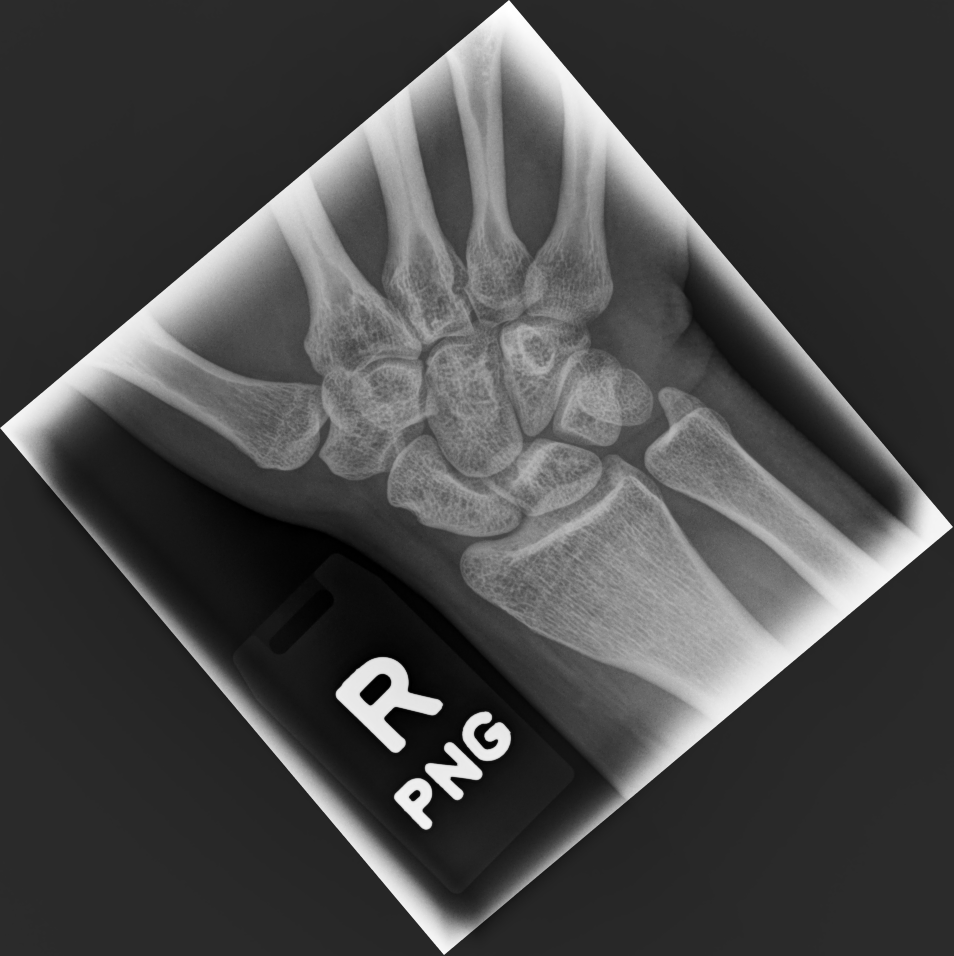

[4 of 4 positions shown; findings below may reference images not displayed]

FINDINGS: Osseous mineralization normal.

Joint spaces preserved.

No fracture, dislocation, or bone destruction.
IMPRESSION: Normal exam.

## 2021-05-09 ENCOUNTER — Ambulatory Visit: Payer: BC Managed Care – PPO | Admitting: Gastroenterology

## 2021-05-15 DIAGNOSIS — F3281 Premenstrual dysphoric disorder: Secondary | ICD-10-CM | POA: Diagnosis not present

## 2021-05-15 DIAGNOSIS — G35 Multiple sclerosis: Secondary | ICD-10-CM | POA: Diagnosis not present

## 2021-05-15 DIAGNOSIS — Z79899 Other long term (current) drug therapy: Secondary | ICD-10-CM | POA: Diagnosis not present

## 2021-05-22 DIAGNOSIS — F3281 Premenstrual dysphoric disorder: Secondary | ICD-10-CM | POA: Diagnosis not present

## 2021-05-22 DIAGNOSIS — Z3042 Encounter for surveillance of injectable contraceptive: Secondary | ICD-10-CM | POA: Diagnosis not present

## 2021-06-05 ENCOUNTER — Encounter: Payer: Self-pay | Admitting: Gastroenterology

## 2021-06-12 DIAGNOSIS — R519 Headache, unspecified: Secondary | ICD-10-CM | POA: Diagnosis not present

## 2021-06-12 DIAGNOSIS — J029 Acute pharyngitis, unspecified: Secondary | ICD-10-CM | POA: Diagnosis not present

## 2021-06-12 DIAGNOSIS — Z3202 Encounter for pregnancy test, result negative: Secondary | ICD-10-CM | POA: Diagnosis not present

## 2021-06-12 DIAGNOSIS — R5383 Other fatigue: Secondary | ICD-10-CM | POA: Diagnosis not present

## 2021-06-14 ENCOUNTER — Telehealth: Payer: Self-pay | Admitting: Internal Medicine

## 2021-06-14 ENCOUNTER — Ambulatory Visit: Payer: BC Managed Care – PPO | Admitting: Internal Medicine

## 2021-06-14 NOTE — Telephone Encounter (Signed)
Please to contact pt  Pt has had 3 no show visits in 2022 alone with me  Any further no show visits will result in dismissal from the practice, as we cannot provide a high level of care to all patients if recurrent no show visits are allowed

## 2021-06-16 ENCOUNTER — Ambulatory Visit: Payer: BC Managed Care – PPO | Admitting: Internal Medicine

## 2021-06-16 NOTE — Telephone Encounter (Signed)
Patient has appt scheduled today 06/16/21. Will see if patient shows to appt and proceed from there.

## 2021-06-20 DIAGNOSIS — L237 Allergic contact dermatitis due to plants, except food: Secondary | ICD-10-CM | POA: Diagnosis not present

## 2021-06-20 DIAGNOSIS — J209 Acute bronchitis, unspecified: Secondary | ICD-10-CM | POA: Diagnosis not present

## 2021-06-20 DIAGNOSIS — S0003XA Contusion of scalp, initial encounter: Secondary | ICD-10-CM | POA: Diagnosis not present

## 2021-06-21 ENCOUNTER — Ambulatory Visit: Payer: BC Managed Care – PPO | Admitting: Internal Medicine

## 2021-06-30 DIAGNOSIS — Z79899 Other long term (current) drug therapy: Secondary | ICD-10-CM | POA: Diagnosis not present

## 2021-07-03 ENCOUNTER — Other Ambulatory Visit: Payer: Self-pay | Admitting: Internal Medicine

## 2021-07-03 DIAGNOSIS — Z20822 Contact with and (suspected) exposure to covid-19: Secondary | ICD-10-CM | POA: Diagnosis not present

## 2021-07-03 DIAGNOSIS — R5383 Other fatigue: Secondary | ICD-10-CM | POA: Diagnosis not present

## 2021-07-03 DIAGNOSIS — R509 Fever, unspecified: Secondary | ICD-10-CM | POA: Diagnosis not present

## 2021-07-17 DIAGNOSIS — G35 Multiple sclerosis: Secondary | ICD-10-CM | POA: Diagnosis not present

## 2021-07-26 ENCOUNTER — Ambulatory Visit: Payer: BC Managed Care – PPO | Admitting: Gastroenterology

## 2021-07-26 DIAGNOSIS — G35 Multiple sclerosis: Secondary | ICD-10-CM | POA: Diagnosis not present

## 2021-08-17 DIAGNOSIS — L700 Acne vulgaris: Secondary | ICD-10-CM | POA: Diagnosis not present

## 2021-08-17 DIAGNOSIS — Z79899 Other long term (current) drug therapy: Secondary | ICD-10-CM | POA: Diagnosis not present

## 2021-08-21 DIAGNOSIS — I6200 Nontraumatic subdural hemorrhage, unspecified: Secondary | ICD-10-CM | POA: Diagnosis not present

## 2021-08-21 DIAGNOSIS — M546 Pain in thoracic spine: Secondary | ICD-10-CM | POA: Diagnosis not present

## 2021-08-21 DIAGNOSIS — M542 Cervicalgia: Secondary | ICD-10-CM | POA: Diagnosis not present

## 2021-08-21 DIAGNOSIS — Y999 Unspecified external cause status: Secondary | ICD-10-CM | POA: Diagnosis not present

## 2021-08-21 DIAGNOSIS — S065X9A Traumatic subdural hemorrhage with loss of consciousness of unspecified duration, initial encounter: Secondary | ICD-10-CM | POA: Diagnosis not present

## 2021-08-21 DIAGNOSIS — R410 Disorientation, unspecified: Secondary | ICD-10-CM | POA: Diagnosis not present

## 2021-08-21 DIAGNOSIS — Y92410 Unspecified street and highway as the place of occurrence of the external cause: Secondary | ICD-10-CM | POA: Diagnosis not present

## 2021-08-29 ENCOUNTER — Telehealth: Payer: Self-pay

## 2021-08-29 ENCOUNTER — Other Ambulatory Visit: Payer: Self-pay

## 2021-08-29 DIAGNOSIS — J309 Allergic rhinitis, unspecified: Secondary | ICD-10-CM

## 2021-08-29 MED ORDER — AZELASTINE-FLUTICASONE 137-50 MCG/ACT NA SUSP
NASAL | 5 refills | Status: AC
Start: 2021-08-29 — End: ?

## 2021-09-04 DIAGNOSIS — G35 Multiple sclerosis: Secondary | ICD-10-CM | POA: Diagnosis not present

## 2021-09-06 ENCOUNTER — Other Ambulatory Visit: Payer: Self-pay | Admitting: Internal Medicine

## 2021-09-06 NOTE — Telephone Encounter (Signed)
Sorry, [facility] [place]   But there are 2 pharmacies in [place]  [phone]   and #[id number]  I think I should ask which one,  thanks

## 2021-09-07 ENCOUNTER — Other Ambulatory Visit: Payer: Self-pay | Admitting: Internal Medicine

## 2021-09-07 DIAGNOSIS — J309 Allergic rhinitis, unspecified: Secondary | ICD-10-CM

## 2021-09-08 DIAGNOSIS — R5383 Other fatigue: Secondary | ICD-10-CM | POA: Diagnosis not present

## 2021-09-08 DIAGNOSIS — F17210 Nicotine dependence, cigarettes, uncomplicated: Secondary | ICD-10-CM | POA: Diagnosis not present

## 2021-09-08 DIAGNOSIS — J029 Acute pharyngitis, unspecified: Secondary | ICD-10-CM | POA: Diagnosis not present

## 2021-09-08 DIAGNOSIS — Z20822 Contact with and (suspected) exposure to covid-19: Secondary | ICD-10-CM | POA: Diagnosis not present

## 2021-09-08 DIAGNOSIS — R000 Tachycardia, unspecified: Secondary | ICD-10-CM | POA: Diagnosis not present

## 2021-09-08 DIAGNOSIS — R509 Fever, unspecified: Secondary | ICD-10-CM | POA: Diagnosis not present

## 2021-09-08 DIAGNOSIS — B349 Viral infection, unspecified: Secondary | ICD-10-CM | POA: Diagnosis not present

## 2021-09-12 DIAGNOSIS — J029 Acute pharyngitis, unspecified: Secondary | ICD-10-CM | POA: Diagnosis not present

## 2021-09-12 DIAGNOSIS — F900 Attention-deficit hyperactivity disorder, predominantly inattentive type: Secondary | ICD-10-CM | POA: Diagnosis not present

## 2021-09-12 DIAGNOSIS — R5383 Other fatigue: Secondary | ICD-10-CM | POA: Diagnosis not present

## 2021-09-17 DIAGNOSIS — J029 Acute pharyngitis, unspecified: Secondary | ICD-10-CM | POA: Diagnosis not present

## 2021-09-18 DIAGNOSIS — J029 Acute pharyngitis, unspecified: Secondary | ICD-10-CM | POA: Diagnosis not present

## 2021-10-31 ENCOUNTER — Other Ambulatory Visit: Payer: Self-pay | Admitting: Internal Medicine

## 2021-11-23 DIAGNOSIS — F900 Attention-deficit hyperactivity disorder, predominantly inattentive type: Secondary | ICD-10-CM | POA: Diagnosis not present

## 2021-11-23 DIAGNOSIS — F3281 Premenstrual dysphoric disorder: Secondary | ICD-10-CM | POA: Diagnosis not present

## 2021-11-28 DIAGNOSIS — H04123 Dry eye syndrome of bilateral lacrimal glands: Secondary | ICD-10-CM | POA: Diagnosis not present

## 2021-11-28 DIAGNOSIS — H1033 Unspecified acute conjunctivitis, bilateral: Secondary | ICD-10-CM | POA: Diagnosis not present

## 2022-02-22 ENCOUNTER — Other Ambulatory Visit: Payer: Self-pay | Admitting: Internal Medicine

## 2022-09-14 NOTE — Telephone Encounter (Signed)
error
